# Patient Record
Sex: Female | Born: 1985
Health system: Southern US, Community
[De-identification: ages and names within clinical notes are randomized; demographics above are authoritative.]

---

## 2017-10-19 DIAGNOSIS — J301 Allergic rhinitis due to pollen: Secondary | ICD-10-CM | POA: Diagnosis not present

## 2017-10-19 DIAGNOSIS — J3081 Allergic rhinitis due to animal (cat) (dog) hair and dander: Secondary | ICD-10-CM | POA: Diagnosis not present

## 2017-10-20 DIAGNOSIS — J3089 Other allergic rhinitis: Secondary | ICD-10-CM | POA: Diagnosis not present

## 2017-11-01 DIAGNOSIS — J3081 Allergic rhinitis due to animal (cat) (dog) hair and dander: Secondary | ICD-10-CM | POA: Diagnosis not present

## 2017-11-01 DIAGNOSIS — J301 Allergic rhinitis due to pollen: Secondary | ICD-10-CM | POA: Diagnosis not present

## 2017-11-01 DIAGNOSIS — J3089 Other allergic rhinitis: Secondary | ICD-10-CM | POA: Diagnosis not present

## 2017-11-03 DIAGNOSIS — J3081 Allergic rhinitis due to animal (cat) (dog) hair and dander: Secondary | ICD-10-CM | POA: Diagnosis not present

## 2017-11-03 DIAGNOSIS — J301 Allergic rhinitis due to pollen: Secondary | ICD-10-CM | POA: Diagnosis not present

## 2017-11-03 DIAGNOSIS — J3089 Other allergic rhinitis: Secondary | ICD-10-CM | POA: Diagnosis not present

## 2017-11-08 DIAGNOSIS — J301 Allergic rhinitis due to pollen: Secondary | ICD-10-CM | POA: Diagnosis not present

## 2017-11-08 DIAGNOSIS — J3081 Allergic rhinitis due to animal (cat) (dog) hair and dander: Secondary | ICD-10-CM | POA: Diagnosis not present

## 2017-11-08 DIAGNOSIS — J3089 Other allergic rhinitis: Secondary | ICD-10-CM | POA: Diagnosis not present

## 2017-11-15 DIAGNOSIS — J3089 Other allergic rhinitis: Secondary | ICD-10-CM | POA: Diagnosis not present

## 2017-11-15 DIAGNOSIS — J301 Allergic rhinitis due to pollen: Secondary | ICD-10-CM | POA: Diagnosis not present

## 2017-11-15 DIAGNOSIS — J3081 Allergic rhinitis due to animal (cat) (dog) hair and dander: Secondary | ICD-10-CM | POA: Diagnosis not present

## 2017-11-17 DIAGNOSIS — J301 Allergic rhinitis due to pollen: Secondary | ICD-10-CM | POA: Diagnosis not present

## 2017-11-17 DIAGNOSIS — J3089 Other allergic rhinitis: Secondary | ICD-10-CM | POA: Diagnosis not present

## 2017-11-17 DIAGNOSIS — J3081 Allergic rhinitis due to animal (cat) (dog) hair and dander: Secondary | ICD-10-CM | POA: Diagnosis not present

## 2017-11-22 DIAGNOSIS — J3081 Allergic rhinitis due to animal (cat) (dog) hair and dander: Secondary | ICD-10-CM | POA: Diagnosis not present

## 2017-11-22 DIAGNOSIS — J301 Allergic rhinitis due to pollen: Secondary | ICD-10-CM | POA: Diagnosis not present

## 2017-11-22 DIAGNOSIS — J3089 Other allergic rhinitis: Secondary | ICD-10-CM | POA: Diagnosis not present

## 2017-11-25 DIAGNOSIS — J301 Allergic rhinitis due to pollen: Secondary | ICD-10-CM | POA: Diagnosis not present

## 2017-11-25 DIAGNOSIS — J3081 Allergic rhinitis due to animal (cat) (dog) hair and dander: Secondary | ICD-10-CM | POA: Diagnosis not present

## 2017-11-25 DIAGNOSIS — J3089 Other allergic rhinitis: Secondary | ICD-10-CM | POA: Diagnosis not present

## 2017-11-29 DIAGNOSIS — J3089 Other allergic rhinitis: Secondary | ICD-10-CM | POA: Diagnosis not present

## 2017-11-29 DIAGNOSIS — J3081 Allergic rhinitis due to animal (cat) (dog) hair and dander: Secondary | ICD-10-CM | POA: Diagnosis not present

## 2017-11-29 DIAGNOSIS — J301 Allergic rhinitis due to pollen: Secondary | ICD-10-CM | POA: Diagnosis not present

## 2017-12-01 DIAGNOSIS — J3089 Other allergic rhinitis: Secondary | ICD-10-CM | POA: Diagnosis not present

## 2017-12-01 DIAGNOSIS — J3081 Allergic rhinitis due to animal (cat) (dog) hair and dander: Secondary | ICD-10-CM | POA: Diagnosis not present

## 2017-12-01 DIAGNOSIS — J301 Allergic rhinitis due to pollen: Secondary | ICD-10-CM | POA: Diagnosis not present

## 2017-12-12 DIAGNOSIS — J3081 Allergic rhinitis due to animal (cat) (dog) hair and dander: Secondary | ICD-10-CM | POA: Diagnosis not present

## 2017-12-12 DIAGNOSIS — J301 Allergic rhinitis due to pollen: Secondary | ICD-10-CM | POA: Diagnosis not present

## 2017-12-12 DIAGNOSIS — J3089 Other allergic rhinitis: Secondary | ICD-10-CM | POA: Diagnosis not present

## 2017-12-15 DIAGNOSIS — J3089 Other allergic rhinitis: Secondary | ICD-10-CM | POA: Diagnosis not present

## 2017-12-15 DIAGNOSIS — J3081 Allergic rhinitis due to animal (cat) (dog) hair and dander: Secondary | ICD-10-CM | POA: Diagnosis not present

## 2017-12-15 DIAGNOSIS — J301 Allergic rhinitis due to pollen: Secondary | ICD-10-CM | POA: Diagnosis not present

## 2017-12-20 DIAGNOSIS — J3081 Allergic rhinitis due to animal (cat) (dog) hair and dander: Secondary | ICD-10-CM | POA: Diagnosis not present

## 2017-12-20 DIAGNOSIS — J301 Allergic rhinitis due to pollen: Secondary | ICD-10-CM | POA: Diagnosis not present

## 2017-12-20 DIAGNOSIS — J3089 Other allergic rhinitis: Secondary | ICD-10-CM | POA: Diagnosis not present

## 2017-12-22 DIAGNOSIS — J3089 Other allergic rhinitis: Secondary | ICD-10-CM | POA: Diagnosis not present

## 2017-12-22 DIAGNOSIS — J3081 Allergic rhinitis due to animal (cat) (dog) hair and dander: Secondary | ICD-10-CM | POA: Diagnosis not present

## 2017-12-22 DIAGNOSIS — J301 Allergic rhinitis due to pollen: Secondary | ICD-10-CM | POA: Diagnosis not present

## 2017-12-27 DIAGNOSIS — J3089 Other allergic rhinitis: Secondary | ICD-10-CM | POA: Diagnosis not present

## 2017-12-27 DIAGNOSIS — J3081 Allergic rhinitis due to animal (cat) (dog) hair and dander: Secondary | ICD-10-CM | POA: Diagnosis not present

## 2017-12-27 DIAGNOSIS — J301 Allergic rhinitis due to pollen: Secondary | ICD-10-CM | POA: Diagnosis not present

## 2017-12-29 DIAGNOSIS — J3081 Allergic rhinitis due to animal (cat) (dog) hair and dander: Secondary | ICD-10-CM | POA: Diagnosis not present

## 2017-12-29 DIAGNOSIS — J3089 Other allergic rhinitis: Secondary | ICD-10-CM | POA: Diagnosis not present

## 2017-12-29 DIAGNOSIS — J301 Allergic rhinitis due to pollen: Secondary | ICD-10-CM | POA: Diagnosis not present

## 2018-01-03 DIAGNOSIS — J301 Allergic rhinitis due to pollen: Secondary | ICD-10-CM | POA: Diagnosis not present

## 2018-01-03 DIAGNOSIS — J3081 Allergic rhinitis due to animal (cat) (dog) hair and dander: Secondary | ICD-10-CM | POA: Diagnosis not present

## 2018-01-03 DIAGNOSIS — J3089 Other allergic rhinitis: Secondary | ICD-10-CM | POA: Diagnosis not present

## 2018-01-05 DIAGNOSIS — J301 Allergic rhinitis due to pollen: Secondary | ICD-10-CM | POA: Diagnosis not present

## 2018-01-05 DIAGNOSIS — J3081 Allergic rhinitis due to animal (cat) (dog) hair and dander: Secondary | ICD-10-CM | POA: Diagnosis not present

## 2018-01-05 DIAGNOSIS — J3089 Other allergic rhinitis: Secondary | ICD-10-CM | POA: Diagnosis not present

## 2018-01-10 DIAGNOSIS — J3081 Allergic rhinitis due to animal (cat) (dog) hair and dander: Secondary | ICD-10-CM | POA: Diagnosis not present

## 2018-01-10 DIAGNOSIS — J3089 Other allergic rhinitis: Secondary | ICD-10-CM | POA: Diagnosis not present

## 2018-01-10 DIAGNOSIS — J301 Allergic rhinitis due to pollen: Secondary | ICD-10-CM | POA: Diagnosis not present

## 2018-01-12 DIAGNOSIS — J3089 Other allergic rhinitis: Secondary | ICD-10-CM | POA: Diagnosis not present

## 2018-01-12 DIAGNOSIS — J3081 Allergic rhinitis due to animal (cat) (dog) hair and dander: Secondary | ICD-10-CM | POA: Diagnosis not present

## 2018-01-12 DIAGNOSIS — J301 Allergic rhinitis due to pollen: Secondary | ICD-10-CM | POA: Diagnosis not present

## 2018-01-17 DIAGNOSIS — J3089 Other allergic rhinitis: Secondary | ICD-10-CM | POA: Diagnosis not present

## 2018-01-17 DIAGNOSIS — J301 Allergic rhinitis due to pollen: Secondary | ICD-10-CM | POA: Diagnosis not present

## 2018-01-17 DIAGNOSIS — J3081 Allergic rhinitis due to animal (cat) (dog) hair and dander: Secondary | ICD-10-CM | POA: Diagnosis not present

## 2018-01-20 DIAGNOSIS — H1045 Other chronic allergic conjunctivitis: Secondary | ICD-10-CM | POA: Diagnosis not present

## 2018-01-20 DIAGNOSIS — J301 Allergic rhinitis due to pollen: Secondary | ICD-10-CM | POA: Diagnosis not present

## 2018-01-20 DIAGNOSIS — J3081 Allergic rhinitis due to animal (cat) (dog) hair and dander: Secondary | ICD-10-CM | POA: Diagnosis not present

## 2018-01-20 DIAGNOSIS — J3089 Other allergic rhinitis: Secondary | ICD-10-CM | POA: Diagnosis not present

## 2018-01-23 DIAGNOSIS — J301 Allergic rhinitis due to pollen: Secondary | ICD-10-CM | POA: Diagnosis not present

## 2018-01-23 DIAGNOSIS — J3089 Other allergic rhinitis: Secondary | ICD-10-CM | POA: Diagnosis not present

## 2018-01-23 DIAGNOSIS — J3081 Allergic rhinitis due to animal (cat) (dog) hair and dander: Secondary | ICD-10-CM | POA: Diagnosis not present

## 2018-01-31 DIAGNOSIS — J3081 Allergic rhinitis due to animal (cat) (dog) hair and dander: Secondary | ICD-10-CM | POA: Diagnosis not present

## 2018-01-31 DIAGNOSIS — J301 Allergic rhinitis due to pollen: Secondary | ICD-10-CM | POA: Diagnosis not present

## 2018-01-31 DIAGNOSIS — J3089 Other allergic rhinitis: Secondary | ICD-10-CM | POA: Diagnosis not present

## 2018-02-06 DIAGNOSIS — J3081 Allergic rhinitis due to animal (cat) (dog) hair and dander: Secondary | ICD-10-CM | POA: Diagnosis not present

## 2018-02-06 DIAGNOSIS — J3089 Other allergic rhinitis: Secondary | ICD-10-CM | POA: Diagnosis not present

## 2018-02-06 DIAGNOSIS — J301 Allergic rhinitis due to pollen: Secondary | ICD-10-CM | POA: Diagnosis not present

## 2018-02-14 DIAGNOSIS — J301 Allergic rhinitis due to pollen: Secondary | ICD-10-CM | POA: Diagnosis not present

## 2018-02-14 DIAGNOSIS — J3081 Allergic rhinitis due to animal (cat) (dog) hair and dander: Secondary | ICD-10-CM | POA: Diagnosis not present

## 2018-02-14 DIAGNOSIS — J3089 Other allergic rhinitis: Secondary | ICD-10-CM | POA: Diagnosis not present

## 2018-02-16 DIAGNOSIS — J301 Allergic rhinitis due to pollen: Secondary | ICD-10-CM | POA: Diagnosis not present

## 2018-02-16 DIAGNOSIS — J3081 Allergic rhinitis due to animal (cat) (dog) hair and dander: Secondary | ICD-10-CM | POA: Diagnosis not present

## 2018-02-17 DIAGNOSIS — J3089 Other allergic rhinitis: Secondary | ICD-10-CM | POA: Diagnosis not present

## 2018-02-21 DIAGNOSIS — J3081 Allergic rhinitis due to animal (cat) (dog) hair and dander: Secondary | ICD-10-CM | POA: Diagnosis not present

## 2018-02-21 DIAGNOSIS — J301 Allergic rhinitis due to pollen: Secondary | ICD-10-CM | POA: Diagnosis not present

## 2018-02-21 DIAGNOSIS — J3089 Other allergic rhinitis: Secondary | ICD-10-CM | POA: Diagnosis not present

## 2018-02-28 DIAGNOSIS — J3081 Allergic rhinitis due to animal (cat) (dog) hair and dander: Secondary | ICD-10-CM | POA: Diagnosis not present

## 2018-02-28 DIAGNOSIS — J301 Allergic rhinitis due to pollen: Secondary | ICD-10-CM | POA: Diagnosis not present

## 2018-02-28 DIAGNOSIS — J3089 Other allergic rhinitis: Secondary | ICD-10-CM | POA: Diagnosis not present

## 2018-03-06 DIAGNOSIS — J301 Allergic rhinitis due to pollen: Secondary | ICD-10-CM | POA: Diagnosis not present

## 2018-03-06 DIAGNOSIS — J3089 Other allergic rhinitis: Secondary | ICD-10-CM | POA: Diagnosis not present

## 2018-03-06 DIAGNOSIS — J3081 Allergic rhinitis due to animal (cat) (dog) hair and dander: Secondary | ICD-10-CM | POA: Diagnosis not present

## 2018-03-14 DIAGNOSIS — J301 Allergic rhinitis due to pollen: Secondary | ICD-10-CM | POA: Diagnosis not present

## 2018-03-14 DIAGNOSIS — J3089 Other allergic rhinitis: Secondary | ICD-10-CM | POA: Diagnosis not present

## 2018-03-14 DIAGNOSIS — J3081 Allergic rhinitis due to animal (cat) (dog) hair and dander: Secondary | ICD-10-CM | POA: Diagnosis not present

## 2018-03-16 DIAGNOSIS — J3089 Other allergic rhinitis: Secondary | ICD-10-CM | POA: Diagnosis not present

## 2018-03-16 DIAGNOSIS — J3081 Allergic rhinitis due to animal (cat) (dog) hair and dander: Secondary | ICD-10-CM | POA: Diagnosis not present

## 2018-03-16 DIAGNOSIS — J301 Allergic rhinitis due to pollen: Secondary | ICD-10-CM | POA: Diagnosis not present

## 2018-03-21 DIAGNOSIS — J3089 Other allergic rhinitis: Secondary | ICD-10-CM | POA: Diagnosis not present

## 2018-03-21 DIAGNOSIS — J3081 Allergic rhinitis due to animal (cat) (dog) hair and dander: Secondary | ICD-10-CM | POA: Diagnosis not present

## 2018-03-21 DIAGNOSIS — J301 Allergic rhinitis due to pollen: Secondary | ICD-10-CM | POA: Diagnosis not present

## 2018-03-23 DIAGNOSIS — J3089 Other allergic rhinitis: Secondary | ICD-10-CM | POA: Diagnosis not present

## 2018-03-23 DIAGNOSIS — J301 Allergic rhinitis due to pollen: Secondary | ICD-10-CM | POA: Diagnosis not present

## 2018-03-27 DIAGNOSIS — J3089 Other allergic rhinitis: Secondary | ICD-10-CM | POA: Diagnosis not present

## 2018-03-27 DIAGNOSIS — Z1389 Encounter for screening for other disorder: Secondary | ICD-10-CM | POA: Diagnosis not present

## 2018-03-27 DIAGNOSIS — Z30431 Encounter for routine checking of intrauterine contraceptive device: Secondary | ICD-10-CM | POA: Diagnosis not present

## 2018-03-27 DIAGNOSIS — Z130 Encounter for screening for diseases of the blood and blood-forming organs and certain disorders involving the immune mechanism: Secondary | ICD-10-CM | POA: Diagnosis not present

## 2018-03-27 DIAGNOSIS — Z01419 Encounter for gynecological examination (general) (routine) without abnormal findings: Secondary | ICD-10-CM | POA: Diagnosis not present

## 2018-03-27 DIAGNOSIS — J3081 Allergic rhinitis due to animal (cat) (dog) hair and dander: Secondary | ICD-10-CM | POA: Diagnosis not present

## 2018-03-27 DIAGNOSIS — J301 Allergic rhinitis due to pollen: Secondary | ICD-10-CM | POA: Diagnosis not present

## 2018-04-03 DIAGNOSIS — J301 Allergic rhinitis due to pollen: Secondary | ICD-10-CM | POA: Diagnosis not present

## 2018-04-03 DIAGNOSIS — J3081 Allergic rhinitis due to animal (cat) (dog) hair and dander: Secondary | ICD-10-CM | POA: Diagnosis not present

## 2018-04-03 DIAGNOSIS — J3089 Other allergic rhinitis: Secondary | ICD-10-CM | POA: Diagnosis not present

## 2018-04-11 DIAGNOSIS — J301 Allergic rhinitis due to pollen: Secondary | ICD-10-CM | POA: Diagnosis not present

## 2018-04-11 DIAGNOSIS — J3089 Other allergic rhinitis: Secondary | ICD-10-CM | POA: Diagnosis not present

## 2018-04-11 DIAGNOSIS — J3081 Allergic rhinitis due to animal (cat) (dog) hair and dander: Secondary | ICD-10-CM | POA: Diagnosis not present

## 2018-04-17 DIAGNOSIS — J3081 Allergic rhinitis due to animal (cat) (dog) hair and dander: Secondary | ICD-10-CM | POA: Diagnosis not present

## 2018-04-17 DIAGNOSIS — J3089 Other allergic rhinitis: Secondary | ICD-10-CM | POA: Diagnosis not present

## 2018-04-17 DIAGNOSIS — J301 Allergic rhinitis due to pollen: Secondary | ICD-10-CM | POA: Diagnosis not present

## 2018-04-24 DIAGNOSIS — J3081 Allergic rhinitis due to animal (cat) (dog) hair and dander: Secondary | ICD-10-CM | POA: Diagnosis not present

## 2018-04-24 DIAGNOSIS — J301 Allergic rhinitis due to pollen: Secondary | ICD-10-CM | POA: Diagnosis not present

## 2018-04-24 DIAGNOSIS — J3089 Other allergic rhinitis: Secondary | ICD-10-CM | POA: Diagnosis not present

## 2018-05-04 DIAGNOSIS — J301 Allergic rhinitis due to pollen: Secondary | ICD-10-CM | POA: Diagnosis not present

## 2018-05-04 DIAGNOSIS — J3081 Allergic rhinitis due to animal (cat) (dog) hair and dander: Secondary | ICD-10-CM | POA: Diagnosis not present

## 2018-05-04 DIAGNOSIS — J3089 Other allergic rhinitis: Secondary | ICD-10-CM | POA: Diagnosis not present

## 2018-05-08 DIAGNOSIS — J301 Allergic rhinitis due to pollen: Secondary | ICD-10-CM | POA: Diagnosis not present

## 2018-05-08 DIAGNOSIS — J3089 Other allergic rhinitis: Secondary | ICD-10-CM | POA: Diagnosis not present

## 2018-05-08 DIAGNOSIS — J3081 Allergic rhinitis due to animal (cat) (dog) hair and dander: Secondary | ICD-10-CM | POA: Diagnosis not present

## 2018-05-15 DIAGNOSIS — J301 Allergic rhinitis due to pollen: Secondary | ICD-10-CM | POA: Diagnosis not present

## 2018-05-15 DIAGNOSIS — J3081 Allergic rhinitis due to animal (cat) (dog) hair and dander: Secondary | ICD-10-CM | POA: Diagnosis not present

## 2018-05-15 DIAGNOSIS — J3089 Other allergic rhinitis: Secondary | ICD-10-CM | POA: Diagnosis not present

## 2018-05-25 DIAGNOSIS — J3081 Allergic rhinitis due to animal (cat) (dog) hair and dander: Secondary | ICD-10-CM | POA: Diagnosis not present

## 2018-05-25 DIAGNOSIS — J3089 Other allergic rhinitis: Secondary | ICD-10-CM | POA: Diagnosis not present

## 2018-05-25 DIAGNOSIS — J301 Allergic rhinitis due to pollen: Secondary | ICD-10-CM | POA: Diagnosis not present

## 2018-05-26 DIAGNOSIS — Z30432 Encounter for removal of intrauterine contraceptive device: Secondary | ICD-10-CM | POA: Diagnosis not present

## 2018-05-29 DIAGNOSIS — J301 Allergic rhinitis due to pollen: Secondary | ICD-10-CM | POA: Diagnosis not present

## 2018-05-29 DIAGNOSIS — J3089 Other allergic rhinitis: Secondary | ICD-10-CM | POA: Diagnosis not present

## 2018-05-29 DIAGNOSIS — J3081 Allergic rhinitis due to animal (cat) (dog) hair and dander: Secondary | ICD-10-CM | POA: Diagnosis not present

## 2018-06-06 DIAGNOSIS — J3081 Allergic rhinitis due to animal (cat) (dog) hair and dander: Secondary | ICD-10-CM | POA: Diagnosis not present

## 2018-06-06 DIAGNOSIS — J3089 Other allergic rhinitis: Secondary | ICD-10-CM | POA: Diagnosis not present

## 2018-06-06 DIAGNOSIS — J301 Allergic rhinitis due to pollen: Secondary | ICD-10-CM | POA: Diagnosis not present

## 2018-06-14 DIAGNOSIS — J3081 Allergic rhinitis due to animal (cat) (dog) hair and dander: Secondary | ICD-10-CM | POA: Diagnosis not present

## 2018-06-14 DIAGNOSIS — J301 Allergic rhinitis due to pollen: Secondary | ICD-10-CM | POA: Diagnosis not present

## 2018-06-14 DIAGNOSIS — J3089 Other allergic rhinitis: Secondary | ICD-10-CM | POA: Diagnosis not present

## 2018-06-19 DIAGNOSIS — J3089 Other allergic rhinitis: Secondary | ICD-10-CM | POA: Diagnosis not present

## 2018-06-19 DIAGNOSIS — J3081 Allergic rhinitis due to animal (cat) (dog) hair and dander: Secondary | ICD-10-CM | POA: Diagnosis not present

## 2018-06-19 DIAGNOSIS — J301 Allergic rhinitis due to pollen: Secondary | ICD-10-CM | POA: Diagnosis not present

## 2018-06-26 DIAGNOSIS — J3089 Other allergic rhinitis: Secondary | ICD-10-CM | POA: Diagnosis not present

## 2018-06-26 DIAGNOSIS — J3081 Allergic rhinitis due to animal (cat) (dog) hair and dander: Secondary | ICD-10-CM | POA: Diagnosis not present

## 2018-06-26 DIAGNOSIS — J301 Allergic rhinitis due to pollen: Secondary | ICD-10-CM | POA: Diagnosis not present

## 2018-07-04 DIAGNOSIS — J3081 Allergic rhinitis due to animal (cat) (dog) hair and dander: Secondary | ICD-10-CM | POA: Diagnosis not present

## 2018-07-04 DIAGNOSIS — J301 Allergic rhinitis due to pollen: Secondary | ICD-10-CM | POA: Diagnosis not present

## 2018-07-04 DIAGNOSIS — J3089 Other allergic rhinitis: Secondary | ICD-10-CM | POA: Diagnosis not present

## 2018-07-10 DIAGNOSIS — J3081 Allergic rhinitis due to animal (cat) (dog) hair and dander: Secondary | ICD-10-CM | POA: Diagnosis not present

## 2018-07-10 DIAGNOSIS — J301 Allergic rhinitis due to pollen: Secondary | ICD-10-CM | POA: Diagnosis not present

## 2018-07-10 DIAGNOSIS — J3089 Other allergic rhinitis: Secondary | ICD-10-CM | POA: Diagnosis not present

## 2018-07-18 DIAGNOSIS — J3081 Allergic rhinitis due to animal (cat) (dog) hair and dander: Secondary | ICD-10-CM | POA: Diagnosis not present

## 2018-07-18 DIAGNOSIS — J3089 Other allergic rhinitis: Secondary | ICD-10-CM | POA: Diagnosis not present

## 2018-07-18 DIAGNOSIS — J301 Allergic rhinitis due to pollen: Secondary | ICD-10-CM | POA: Diagnosis not present

## 2018-07-24 DIAGNOSIS — J301 Allergic rhinitis due to pollen: Secondary | ICD-10-CM | POA: Diagnosis not present

## 2018-07-25 DIAGNOSIS — J3089 Other allergic rhinitis: Secondary | ICD-10-CM | POA: Diagnosis not present

## 2018-07-26 DIAGNOSIS — J301 Allergic rhinitis due to pollen: Secondary | ICD-10-CM | POA: Diagnosis not present

## 2018-07-26 DIAGNOSIS — J3089 Other allergic rhinitis: Secondary | ICD-10-CM | POA: Diagnosis not present

## 2018-07-26 DIAGNOSIS — J3081 Allergic rhinitis due to animal (cat) (dog) hair and dander: Secondary | ICD-10-CM | POA: Diagnosis not present

## 2018-08-01 DIAGNOSIS — J3089 Other allergic rhinitis: Secondary | ICD-10-CM | POA: Diagnosis not present

## 2018-08-01 DIAGNOSIS — J301 Allergic rhinitis due to pollen: Secondary | ICD-10-CM | POA: Diagnosis not present

## 2018-08-01 DIAGNOSIS — J3081 Allergic rhinitis due to animal (cat) (dog) hair and dander: Secondary | ICD-10-CM | POA: Diagnosis not present

## 2018-08-07 DIAGNOSIS — J3089 Other allergic rhinitis: Secondary | ICD-10-CM | POA: Diagnosis not present

## 2018-08-07 DIAGNOSIS — J301 Allergic rhinitis due to pollen: Secondary | ICD-10-CM | POA: Diagnosis not present

## 2018-08-07 DIAGNOSIS — J3081 Allergic rhinitis due to animal (cat) (dog) hair and dander: Secondary | ICD-10-CM | POA: Diagnosis not present

## 2018-08-10 DIAGNOSIS — J301 Allergic rhinitis due to pollen: Secondary | ICD-10-CM | POA: Diagnosis not present

## 2018-08-10 DIAGNOSIS — J3081 Allergic rhinitis due to animal (cat) (dog) hair and dander: Secondary | ICD-10-CM | POA: Diagnosis not present

## 2018-08-10 DIAGNOSIS — J3089 Other allergic rhinitis: Secondary | ICD-10-CM | POA: Diagnosis not present

## 2018-08-14 DIAGNOSIS — J3081 Allergic rhinitis due to animal (cat) (dog) hair and dander: Secondary | ICD-10-CM | POA: Diagnosis not present

## 2018-08-14 DIAGNOSIS — J301 Allergic rhinitis due to pollen: Secondary | ICD-10-CM | POA: Diagnosis not present

## 2018-08-14 DIAGNOSIS — J3089 Other allergic rhinitis: Secondary | ICD-10-CM | POA: Diagnosis not present

## 2018-08-17 DIAGNOSIS — J3089 Other allergic rhinitis: Secondary | ICD-10-CM | POA: Diagnosis not present

## 2018-08-17 DIAGNOSIS — J3081 Allergic rhinitis due to animal (cat) (dog) hair and dander: Secondary | ICD-10-CM | POA: Diagnosis not present

## 2018-08-17 DIAGNOSIS — J301 Allergic rhinitis due to pollen: Secondary | ICD-10-CM | POA: Diagnosis not present

## 2018-08-22 DIAGNOSIS — J3081 Allergic rhinitis due to animal (cat) (dog) hair and dander: Secondary | ICD-10-CM | POA: Diagnosis not present

## 2018-08-22 DIAGNOSIS — J3089 Other allergic rhinitis: Secondary | ICD-10-CM | POA: Diagnosis not present

## 2018-08-22 DIAGNOSIS — J301 Allergic rhinitis due to pollen: Secondary | ICD-10-CM | POA: Diagnosis not present

## 2018-08-29 DIAGNOSIS — J301 Allergic rhinitis due to pollen: Secondary | ICD-10-CM | POA: Diagnosis not present

## 2018-08-29 DIAGNOSIS — J3081 Allergic rhinitis due to animal (cat) (dog) hair and dander: Secondary | ICD-10-CM | POA: Diagnosis not present

## 2018-08-29 DIAGNOSIS — J3089 Other allergic rhinitis: Secondary | ICD-10-CM | POA: Diagnosis not present

## 2018-09-05 DIAGNOSIS — J3081 Allergic rhinitis due to animal (cat) (dog) hair and dander: Secondary | ICD-10-CM | POA: Diagnosis not present

## 2018-09-05 DIAGNOSIS — J301 Allergic rhinitis due to pollen: Secondary | ICD-10-CM | POA: Diagnosis not present

## 2018-09-05 DIAGNOSIS — J3089 Other allergic rhinitis: Secondary | ICD-10-CM | POA: Diagnosis not present

## 2018-09-11 DIAGNOSIS — J3089 Other allergic rhinitis: Secondary | ICD-10-CM | POA: Diagnosis not present

## 2018-09-11 DIAGNOSIS — J301 Allergic rhinitis due to pollen: Secondary | ICD-10-CM | POA: Diagnosis not present

## 2018-09-11 DIAGNOSIS — J3081 Allergic rhinitis due to animal (cat) (dog) hair and dander: Secondary | ICD-10-CM | POA: Diagnosis not present

## 2018-09-20 DIAGNOSIS — J3089 Other allergic rhinitis: Secondary | ICD-10-CM | POA: Diagnosis not present

## 2018-09-20 DIAGNOSIS — J3081 Allergic rhinitis due to animal (cat) (dog) hair and dander: Secondary | ICD-10-CM | POA: Diagnosis not present

## 2018-09-20 DIAGNOSIS — J301 Allergic rhinitis due to pollen: Secondary | ICD-10-CM | POA: Diagnosis not present

## 2018-09-25 DIAGNOSIS — J3081 Allergic rhinitis due to animal (cat) (dog) hair and dander: Secondary | ICD-10-CM | POA: Diagnosis not present

## 2018-09-25 DIAGNOSIS — J3089 Other allergic rhinitis: Secondary | ICD-10-CM | POA: Diagnosis not present

## 2018-09-25 DIAGNOSIS — J301 Allergic rhinitis due to pollen: Secondary | ICD-10-CM | POA: Diagnosis not present

## 2018-10-03 DIAGNOSIS — J3081 Allergic rhinitis due to animal (cat) (dog) hair and dander: Secondary | ICD-10-CM | POA: Diagnosis not present

## 2018-10-03 DIAGNOSIS — J301 Allergic rhinitis due to pollen: Secondary | ICD-10-CM | POA: Diagnosis not present

## 2018-10-03 DIAGNOSIS — J3089 Other allergic rhinitis: Secondary | ICD-10-CM | POA: Diagnosis not present

## 2018-10-09 DIAGNOSIS — J3081 Allergic rhinitis due to animal (cat) (dog) hair and dander: Secondary | ICD-10-CM | POA: Diagnosis not present

## 2018-10-09 DIAGNOSIS — J301 Allergic rhinitis due to pollen: Secondary | ICD-10-CM | POA: Diagnosis not present

## 2018-10-09 DIAGNOSIS — J3089 Other allergic rhinitis: Secondary | ICD-10-CM | POA: Diagnosis not present

## 2018-10-18 DIAGNOSIS — J301 Allergic rhinitis due to pollen: Secondary | ICD-10-CM | POA: Diagnosis not present

## 2018-10-18 DIAGNOSIS — J3089 Other allergic rhinitis: Secondary | ICD-10-CM | POA: Diagnosis not present

## 2018-10-18 DIAGNOSIS — J3081 Allergic rhinitis due to animal (cat) (dog) hair and dander: Secondary | ICD-10-CM | POA: Diagnosis not present

## 2018-10-24 DIAGNOSIS — J301 Allergic rhinitis due to pollen: Secondary | ICD-10-CM | POA: Diagnosis not present

## 2018-10-24 DIAGNOSIS — J3081 Allergic rhinitis due to animal (cat) (dog) hair and dander: Secondary | ICD-10-CM | POA: Diagnosis not present

## 2018-10-24 DIAGNOSIS — J3089 Other allergic rhinitis: Secondary | ICD-10-CM | POA: Diagnosis not present

## 2018-10-27 DIAGNOSIS — L700 Acne vulgaris: Secondary | ICD-10-CM | POA: Diagnosis not present

## 2018-10-27 DIAGNOSIS — L814 Other melanin hyperpigmentation: Secondary | ICD-10-CM | POA: Diagnosis not present

## 2018-10-27 DIAGNOSIS — D225 Melanocytic nevi of trunk: Secondary | ICD-10-CM | POA: Diagnosis not present

## 2018-10-27 DIAGNOSIS — D2271 Melanocytic nevi of right lower limb, including hip: Secondary | ICD-10-CM | POA: Diagnosis not present

## 2018-10-28 ENCOUNTER — Other Ambulatory Visit: Payer: Self-pay

## 2018-10-28 DIAGNOSIS — N83291 Other ovarian cyst, right side: Secondary | ICD-10-CM | POA: Insufficient documentation

## 2018-10-28 DIAGNOSIS — Z20828 Contact with and (suspected) exposure to other viral communicable diseases: Secondary | ICD-10-CM | POA: Diagnosis not present

## 2018-10-28 DIAGNOSIS — N801 Endometriosis of ovary: Principal | ICD-10-CM | POA: Insufficient documentation

## 2018-10-28 DIAGNOSIS — R9431 Abnormal electrocardiogram [ECG] [EKG]: Secondary | ICD-10-CM | POA: Insufficient documentation

## 2018-10-28 DIAGNOSIS — R10811 Right upper quadrant abdominal tenderness: Secondary | ICD-10-CM | POA: Diagnosis not present

## 2018-10-28 DIAGNOSIS — N838 Other noninflammatory disorders of ovary, fallopian tube and broad ligament: Secondary | ICD-10-CM | POA: Insufficient documentation

## 2018-10-28 DIAGNOSIS — Z791 Long term (current) use of non-steroidal anti-inflammatories (NSAID): Secondary | ICD-10-CM | POA: Diagnosis not present

## 2018-10-28 DIAGNOSIS — Z1159 Encounter for screening for other viral diseases: Secondary | ICD-10-CM | POA: Insufficient documentation

## 2018-10-28 DIAGNOSIS — K661 Hemoperitoneum: Secondary | ICD-10-CM | POA: Diagnosis not present

## 2018-10-28 DIAGNOSIS — R100 Acute abdomen: Secondary | ICD-10-CM | POA: Diagnosis not present

## 2018-10-28 DIAGNOSIS — R197 Diarrhea, unspecified: Secondary | ICD-10-CM | POA: Diagnosis not present

## 2018-10-28 DIAGNOSIS — R58 Hemorrhage, not elsewhere classified: Secondary | ICD-10-CM | POA: Diagnosis not present

## 2018-10-28 DIAGNOSIS — R42 Dizziness and giddiness: Secondary | ICD-10-CM | POA: Diagnosis not present

## 2018-10-29 ENCOUNTER — Emergency Department (HOSPITAL_COMMUNITY): Payer: BC Managed Care – PPO | Admitting: Registered Nurse

## 2018-10-29 ENCOUNTER — Encounter (HOSPITAL_COMMUNITY)
Admission: EM | Disposition: A | Discharge status: Home / Self Care | Payer: Self-pay | Source: Home / Self Care | Attending: Emergency Medicine

## 2018-10-29 ENCOUNTER — Emergency Department (HOSPITAL_COMMUNITY): Payer: BC Managed Care – PPO

## 2018-10-29 ENCOUNTER — Encounter (HOSPITAL_COMMUNITY): Payer: Self-pay | Admitting: Emergency Medicine

## 2018-10-29 ENCOUNTER — Other Ambulatory Visit: Payer: Self-pay

## 2018-10-29 ENCOUNTER — Observation Stay (HOSPITAL_COMMUNITY)
Admission: EM | Admit: 2018-10-29 | Discharge: 2018-10-29 | Disposition: A | Discharge status: Home / Self Care | Payer: BC Managed Care – PPO | Attending: Obstetrics and Gynecology | Admitting: Obstetrics and Gynecology

## 2018-10-29 DIAGNOSIS — Z9889 Other specified postprocedural states: Secondary | ICD-10-CM

## 2018-10-29 DIAGNOSIS — R102 Pelvic and perineal pain: Secondary | ICD-10-CM | POA: Diagnosis not present

## 2018-10-29 DIAGNOSIS — N838 Other noninflammatory disorders of ovary, fallopian tube and broad ligament: Secondary | ICD-10-CM | POA: Diagnosis not present

## 2018-10-29 DIAGNOSIS — N801 Endometriosis of ovary: Secondary | ICD-10-CM | POA: Diagnosis not present

## 2018-10-29 DIAGNOSIS — R1030 Lower abdominal pain, unspecified: Secondary | ICD-10-CM

## 2018-10-29 DIAGNOSIS — N8301 Follicular cyst of right ovary: Secondary | ICD-10-CM | POA: Diagnosis not present

## 2018-10-29 DIAGNOSIS — R100 Acute abdomen: Secondary | ICD-10-CM | POA: Diagnosis present

## 2018-10-29 DIAGNOSIS — R58 Hemorrhage, not elsewhere classified: Secondary | ICD-10-CM

## 2018-10-29 DIAGNOSIS — N83209 Unspecified ovarian cyst, unspecified side: Secondary | ICD-10-CM

## 2018-10-29 DIAGNOSIS — N83201 Unspecified ovarian cyst, right side: Secondary | ICD-10-CM | POA: Diagnosis not present

## 2018-10-29 DIAGNOSIS — R10811 Right upper quadrant abdominal tenderness: Secondary | ICD-10-CM | POA: Diagnosis not present

## 2018-10-29 HISTORY — PX: LAPAROSCOPY: SHX197

## 2018-10-29 LAB — COMPREHENSIVE METABOLIC PANEL
ALT: 16 U/L (ref 0–44)
AST: 19 U/L (ref 15–41)
Albumin: 3.9 g/dL (ref 3.5–5.0)
Alkaline Phosphatase: 42 U/L (ref 38–126)
Anion gap: 8 (ref 5–15)
BUN: 14 mg/dL (ref 6–20)
CO2: 20 mmol/L — ABNORMAL LOW (ref 22–32)
Calcium: 8.7 mg/dL — ABNORMAL LOW (ref 8.9–10.3)
Chloride: 107 mmol/L (ref 98–111)
Creatinine, Ser: 0.79 mg/dL (ref 0.44–1.00)
GFR calc Af Amer: >60 mL/min (ref >60)
GFR calc non Af Amer: >60 mL/min (ref >60)
Glucose, Bld: 139 mg/dL — ABNORMAL HIGH (ref 70–99)
Potassium: 4.2 mmol/L (ref 3.5–5.1)
Sodium: 135 mmol/L (ref 135–145)
Total Bilirubin: 0.5 mg/dL (ref 0.3–1.2)
Total Protein: 6.6 g/dL (ref 6.5–8.1)

## 2018-10-29 LAB — CBC
HCT: 35.6 % — ABNORMAL LOW (ref 36.0–46.0)
Hemoglobin: 11.5 g/dL — ABNORMAL LOW (ref 12.0–15.0)
MCH: 29.3 pg (ref 26.0–34.0)
MCHC: 32.3 g/dL (ref 30.0–36.0)
MCV: 90.6 fL (ref 80.0–100.0)
Platelets: 225 10*3/uL (ref 150–400)
RBC: 3.93 MIL/uL (ref 3.87–5.11)
RDW: 12.7 % (ref 11.5–15.5)
WBC: 16.3 10*3/uL — ABNORMAL HIGH (ref 4.0–10.5)
nRBC: 0 % (ref 0.0–0.2)

## 2018-10-29 LAB — PREPARE RBC (CROSSMATCH)

## 2018-10-29 LAB — URINALYSIS, ROUTINE W REFLEX MICROSCOPIC
Bilirubin Urine: NEGATIVE
Glucose, UA: NEGATIVE mg/dL
Hgb urine dipstick: NEGATIVE
Ketones, ur: 20 mg/dL — AB
Leukocytes,Ua: NEGATIVE
Nitrite: NEGATIVE
Protein, ur: NEGATIVE mg/dL
Specific Gravity, Urine: >1.046 — ABNORMAL HIGH (ref 1.005–1.030)
pH: 5 (ref 5.0–8.0)

## 2018-10-29 LAB — SARS CORONAVIRUS 2 BY RT PCR (HOSPITAL ORDER, PERFORMED IN ~~LOC~~ HOSPITAL LAB): SARS Coronavirus 2: NEGATIVE

## 2018-10-29 LAB — I-STAT BETA HCG BLOOD, ED (MC, WL, AP ONLY): I-stat hCG, quantitative: <5 m[IU]/mL (ref <5)

## 2018-10-29 LAB — LIPASE, BLOOD: Lipase: 34 U/L (ref 11–51)

## 2018-10-29 LAB — ABO/RH: ABO/RH(D): B POS

## 2018-10-29 LAB — LACTIC ACID, PLASMA
Lactic Acid, Venous: 1.2 mmol/L (ref 0.5–1.9)
Lactic Acid, Venous: 0.8 mmol/L (ref 0.5–1.9)

## 2018-10-29 SURGERY — Surgical Case
Anesthesia: *Unknown

## 2018-10-29 SURGERY — LAPAROSCOPY, DIAGNOSTIC
Anesthesia: General | Site: Abdomen

## 2018-10-29 SURGERY — LAPAROSCOPY, DIAGNOSTIC
Anesthesia: General

## 2018-10-29 MED ORDER — BUPIVACAINE HCL (PF) 0.25 % IJ SOLN
INTRAMUSCULAR | Status: DC | PRN
  Administered 2018-10-29: 10 mL

## 2018-10-29 MED ORDER — SODIUM CHLORIDE 0.9% FLUSH
3.0000 mL | Freq: Once | INTRAVENOUS | Status: AC
  Administered 2018-10-29: 3 mL via INTRAVENOUS

## 2018-10-29 MED ORDER — SODIUM CHLORIDE 0.9 % IV SOLN
3.0000 g | Freq: Once | INTRAVENOUS | Status: AC
  Administered 2018-10-29: 3 g via INTRAVENOUS
  Filled 2018-10-29: qty 3

## 2018-10-29 MED ORDER — SODIUM CHLORIDE 0.9 % IR SOLN
Status: DC | PRN
  Administered 2018-10-29: 1000 mL

## 2018-10-29 MED ORDER — ACETAMINOPHEN 500 MG PO TABS
ORAL_TABLET | ORAL | Status: AC
  Administered 2018-10-29: 07:00:00 1000 mg
  Filled 2018-10-29: qty 2

## 2018-10-29 MED ORDER — SUGAMMADEX SODIUM 200 MG/2ML IV SOLN
INTRAVENOUS | Status: DC | PRN
  Administered 2018-10-29: 200 mg via INTRAVENOUS

## 2018-10-29 MED ORDER — MORPHINE SULFATE (PF) 4 MG/ML IV SOLN: 4.0000 mg | Freq: Once | INTRAVENOUS | Status: DC

## 2018-10-29 MED ORDER — SODIUM CHLORIDE 0.9 % IV BOLUS
1000.0000 mL | Freq: Once | INTRAVENOUS | Status: AC
  Administered 2018-10-29: 01:00:00 1000 mL via INTRAVENOUS

## 2018-10-29 MED ORDER — LIDOCAINE 2% (20 MG/ML) 5 ML SYRINGE
INTRAMUSCULAR | Status: DC | PRN
  Administered 2018-10-29: 40 mg via INTRAVENOUS
  Administered 2018-10-29: 60 mg via INTRAVENOUS

## 2018-10-29 MED ORDER — ONDANSETRON HCL 4 MG/2ML IJ SOLN
INTRAMUSCULAR | Status: AC
  Filled 2018-10-29: qty 2

## 2018-10-29 MED ORDER — SCOPOLAMINE 1 MG/3DAYS TD PT72
MEDICATED_PATCH | TRANSDERMAL | Status: AC
  Filled 2018-10-29: qty 1

## 2018-10-29 MED ORDER — FENTANYL CITRATE (PF) 100 MCG/2ML IJ SOLN
50.0000 ug | Freq: Once | INTRAMUSCULAR | Status: AC
  Administered 2018-10-29: 01:00:00 50 ug via INTRAVENOUS
  Filled 2018-10-29: qty 2

## 2018-10-29 MED ORDER — FENTANYL CITRATE (PF) 100 MCG/2ML IJ SOLN
INTRAMUSCULAR | Status: AC
  Filled 2018-10-29: qty 2

## 2018-10-29 MED ORDER — PROPOFOL 10 MG/ML IV BOLUS
INTRAVENOUS | Status: DC | PRN
  Administered 2018-10-29: 20 mg via INTRAVENOUS
  Administered 2018-10-29: 140 mg via INTRAVENOUS

## 2018-10-29 MED ORDER — SODIUM CHLORIDE (PF) 0.9 % IJ SOLN
INTRAMUSCULAR | Status: AC
  Filled 2018-10-29: qty 50

## 2018-10-29 MED ORDER — DEXAMETHASONE SODIUM PHOSPHATE 10 MG/ML IJ SOLN
INTRAMUSCULAR | Status: AC
  Filled 2018-10-29: qty 1

## 2018-10-29 MED ORDER — LACTATED RINGERS IV SOLN
INTRAVENOUS | Status: DC | PRN
  Administered 2018-10-29 (×2): via INTRAVENOUS

## 2018-10-29 MED ORDER — LACTATED RINGERS IV SOLN
INTRAVENOUS | Status: AC | PRN
  Administered 2018-10-29: 1000 mL

## 2018-10-29 MED ORDER — MORPHINE SULFATE (PF) 4 MG/ML IV SOLN
4.0000 mg | Freq: Once | INTRAVENOUS | Status: AC
  Administered 2018-10-29: 04:00:00 4 mg via INTRAVENOUS
  Filled 2018-10-29: qty 1

## 2018-10-29 MED ORDER — IBUPROFEN 600 MG PO TABS: 600.0000 mg | ORAL_TABLET | Freq: Four times a day (QID) | ORAL | 1 refills | Status: AC | PRN

## 2018-10-29 MED ORDER — FENTANYL CITRATE (PF) 250 MCG/5ML IJ SOLN
INTRAMUSCULAR | Status: DC | PRN
  Administered 2018-10-29 (×2): 100 ug via INTRAVENOUS
  Administered 2018-10-29: 50 ug via INTRAVENOUS

## 2018-10-29 MED ORDER — PHENYLEPHRINE 40 MCG/ML (10ML) SYRINGE FOR IV PUSH (FOR BLOOD PRESSURE SUPPORT)
PREFILLED_SYRINGE | INTRAVENOUS | Status: DC | PRN
  Administered 2018-10-29 (×2): 120 ug via INTRAVENOUS
  Administered 2018-10-29 (×2): 80 ug via INTRAVENOUS

## 2018-10-29 MED ORDER — OXYCODONE-ACETAMINOPHEN 5-325 MG PO TABS: 1.0000 | ORAL_TABLET | ORAL | 0 refills | Status: AC | PRN

## 2018-10-29 MED ORDER — BUPIVACAINE-EPINEPHRINE (PF) 0.25% -1:200000 IJ SOLN
INTRAMUSCULAR | Status: AC
  Filled 2018-10-29: qty 30

## 2018-10-29 MED ORDER — PROPOFOL 10 MG/ML IV BOLUS
INTRAVENOUS | Status: AC
  Filled 2018-10-29: qty 20

## 2018-10-29 MED ORDER — MIDAZOLAM HCL 2 MG/2ML IJ SOLN
INTRAMUSCULAR | Status: AC
  Filled 2018-10-29: qty 2

## 2018-10-29 MED ORDER — FENTANYL CITRATE (PF) 250 MCG/5ML IJ SOLN
INTRAMUSCULAR | Status: AC
  Filled 2018-10-29: qty 5

## 2018-10-29 MED ORDER — ONDANSETRON HCL 4 MG/2ML IJ SOLN
INTRAMUSCULAR | Status: DC | PRN
  Administered 2018-10-29: 4 mg via INTRAVENOUS

## 2018-10-29 MED ORDER — IOHEXOL 300 MG/ML  SOLN
100.0000 mL | Freq: Once | INTRAMUSCULAR | Status: AC | PRN
  Administered 2018-10-29: 100 mL via INTRAVENOUS

## 2018-10-29 MED ORDER — DEXAMETHASONE SODIUM PHOSPHATE 10 MG/ML IJ SOLN
INTRAMUSCULAR | Status: DC | PRN
  Administered 2018-10-29: 10 mg via INTRAVENOUS

## 2018-10-29 MED ORDER — ROCURONIUM BROMIDE 10 MG/ML (PF) SYRINGE
PREFILLED_SYRINGE | INTRAVENOUS | Status: DC | PRN
  Administered 2018-10-29: 5 mg via INTRAVENOUS
  Administered 2018-10-29: 30 mg via INTRAVENOUS

## 2018-10-29 MED ORDER — ONDANSETRON HCL 4 MG/2ML IJ SOLN
4.0000 mg | Freq: Once | INTRAMUSCULAR | Status: AC
  Administered 2018-10-29: 01:00:00 4 mg via INTRAVENOUS
  Filled 2018-10-29: qty 2

## 2018-10-29 MED ORDER — FENTANYL CITRATE (PF) 100 MCG/2ML IJ SOLN
25.0000 ug | INTRAMUSCULAR | Status: DC | PRN
  Administered 2018-10-29: 10:00:00 12.5 ug via INTRAVENOUS

## 2018-10-29 MED ORDER — FENTANYL CITRATE (PF) 100 MCG/2ML IJ SOLN
INTRAMUSCULAR | Status: DC | PRN
  Administered 2018-10-29 (×2): 25 ug via INTRAVENOUS
  Administered 2018-10-29: 50 ug via INTRAVENOUS

## 2018-10-29 MED ORDER — SUCCINYLCHOLINE CHLORIDE 200 MG/10ML IV SOSY
PREFILLED_SYRINGE | INTRAVENOUS | Status: DC | PRN
  Administered 2018-10-29: 80 mg via INTRAVENOUS

## 2018-10-29 SURGICAL SUPPLY — 37 items
CABLE HIGH FREQUENCY MONO STRZ (ELECTRODE) ×3 IMPLANT
CATH ROBINSON RED A/P 16FR (CATHETERS) IMPLANT
CHLORAPREP W/TINT 26 (MISCELLANEOUS) ×3 IMPLANT
COVER WAND RF STERILE (DRAPES) IMPLANT
DECANTER SPIKE VIAL GLASS SM (MISCELLANEOUS) ×3 IMPLANT
DERMABOND ADVANCED (GAUZE/BANDAGES/DRESSINGS) ×2
DERMABOND ADVANCED .7 DNX12 (GAUZE/BANDAGES/DRESSINGS) ×1 IMPLANT
DRAPE UNDERBUTTOCKS STRL (DRAPE) ×3 IMPLANT
DRSG OPSITE POSTOP 3X4 (GAUZE/BANDAGES/DRESSINGS) IMPLANT
FILTER SMOKE EVAC LAPAROSHD (FILTER) ×3 IMPLANT
GLOVE BIO SURGEON STRL SZ 6.5 (GLOVE) ×2 IMPLANT
GLOVE BIO SURGEONS STRL SZ 6.5 (GLOVE) ×1
GLOVE BIOGEL PI IND STRL 7.0 (GLOVE) ×1 IMPLANT
GLOVE BIOGEL PI INDICATOR 7.0 (GLOVE) ×2
GOWN STRL REUS W/TWL LRG LVL3 (GOWN DISPOSABLE) ×6 IMPLANT
KIT TURNOVER KIT A (KITS) IMPLANT
MANIPULATOR UTERINE 7CM CLEARV (MISCELLANEOUS) ×3 IMPLANT
NS IRRIG 1000ML POUR BTL (IV SOLUTION) ×3 IMPLANT
PACK LAPAROSCOPY BASIN (CUSTOM PROCEDURE TRAY) ×3 IMPLANT
PACK TRENDGUARD 450 HYBRID PRO (MISCELLANEOUS) IMPLANT
POUCH SPECIMEN RETRIEVAL 10MM (ENDOMECHANICALS) IMPLANT
PROTECTOR NERVE ULNAR (MISCELLANEOUS) ×6 IMPLANT
SET IRRIG TUBING LAPAROSCOPIC (IRRIGATION / IRRIGATOR) ×3 IMPLANT
SET TUBE SMOKE EVAC HIGH FLOW (TUBING) IMPLANT
SHEARS HARMONIC ACE PLUS 36CM (ENDOMECHANICALS) IMPLANT
SHEET LAVH (DRAPES) ×3 IMPLANT
SLEEVE XCEL OPT CAN 5 100 (ENDOMECHANICALS) ×3 IMPLANT
SOLUTION ELECTROLUBE (MISCELLANEOUS) IMPLANT
SUT VIC AB 3-0 PS2 18 (SUTURE) ×2
SUT VIC AB 3-0 PS2 18XBRD (SUTURE) ×1 IMPLANT
SUT VICRYL 0 UR6 27IN ABS (SUTURE) IMPLANT
SYSTEM CARTER THOMASON II (TROCAR) IMPLANT
TOWEL OR 17X26 10 PK STRL BLUE (TOWEL DISPOSABLE) ×6 IMPLANT
TRENDGUARD 450 HYBRID PRO PACK (MISCELLANEOUS)
TROCAR XCEL NON-BLD 11X100MML (ENDOMECHANICALS) ×3 IMPLANT
TROCAR XCEL NON-BLD 5MMX100MML (ENDOMECHANICALS) ×3 IMPLANT
WARMER LAPAROSCOPE (MISCELLANEOUS) IMPLANT

## 2018-10-29 NOTE — Anesthesia Procedure Notes (Signed)
Date/Time: 10/29/2018 9:18 AM Performed by: Cynda Familia, CRNA Oxygen Delivery Method: Simple face mask Placement Confirmation: positive ETCO2 and breath sounds checked- equal and bilateral Dental Injury: Teeth and Oropharynx as per pre-operative assessment

## 2018-10-29 NOTE — Discharge Instructions (Signed)
Call office with any concerns [phone] 

## 2018-10-29 NOTE — H&P (Signed)
Emma Hogan is an 33 y.o.G0 female with presents with complaint of acute abdominal pain. Pt reports pain initially occurred around 1400 yesterday. It was quickly followed by nausea and lightheadness and pelvic pressure ; " felt constipated" . As symptoms persisted she then developed r>l shoulder pain..  She  [facility]. Pt has been hemodynamically stable, with wbc of 16.3. lactic acid of 1.2. CT showed 18mm mass in right adnexa and ill defined density in left adnexa. Her Quant HCG is negative.  Pt has no plans for childbearing. Her husband has had a vasectomy. She did have her IUD removed in jan 2020 due to pelvic pain. She reports a remote history of ovarian cysts causing severe pain.  Pertinent Gynecological History: Menses: regular - next due in a week Bleeding: none Contraception: vasectomy DES exposure: unknown Blood transfusions: none Sexually transmitted diseases: no past history Previous GYN Procedures: none  OB History: G0, P0   Menstrual History: Menarche age: [age] No LMP recorded.    History reviewed. No pertinent past medical history.  History reviewed. No pertinent surgical history.  History reviewed. No pertinent family history.  Social History:  reports that she has never smoked. She has never used smokeless tobacco. She reports that she does not drink alcohol or use drugs.  Allergies: No Known Allergies  (Not in a hospital admission)   Review of Systems  Constitutional: Positive for malaise/fatigue. Negative for chills, fever and weight loss.  Respiratory: Negative for shortness of breath.   Cardiovascular: Negative for chest pain.  Gastrointestinal: Positive for abdominal pain, constipation and nausea. Negative for heartburn and vomiting.  Musculoskeletal: Positive for back pain. Negative for myalgias.  Skin: Negative for rash.  Neurological: Negative for dizziness.  Endo/Heme/Allergies: Does not bruise/bleed easily.  Psychiatric/Behavioral:  Negative for depression, hallucinations, substance abuse and suicidal ideas. The patient is not nervous/anxious.     Blood pressure (!) 107/55, pulse 70, temperature 98.4 F (36.9 C), temperature source Oral, resp. rate 16, height 5\' 3"  (1.6 m), weight 62.6 kg, SpO2 96 %. Physical Exam  Constitutional: She is oriented to person, place, and time. She appears well-developed.  Neck: Normal range of motion.  Cardiovascular: Normal rate.  Respiratory: Effort normal.  GI: Soft.  Musculoskeletal: Normal range of motion.  Neurological: She is alert and oriented to person, place, and time.  Skin: Skin is warm.  Psychiatric: She has a normal mood and affect. Her behavior is normal. Judgment and thought content normal.    Results for orders placed or performed during the hospital encounter of 10/29/18 (from the past 24 hour(s))  Lipase, blood     Status: None   Collection Time: 10/29/18 12:21 AM  Result Value Ref Range   Lipase 34 11 - 51 U/L  Comprehensive metabolic panel     Status: Abnormal   Collection Time: 10/29/18 12:21 AM  Result Value Ref Range   Sodium 135 135 - 145 mmol/L   Potassium 4.2 3.5 - 5.1 mmol/L   Chloride 107 98 - 111 mmol/L   CO2 20 (L) 22 - 32 mmol/L   Glucose, Bld 139 (H) 70 - 99 mg/dL   BUN 14 6 - 20 mg/dL   Creatinine, Ser 1.610.79 0.44 - 1.00 mg/dL   Calcium 8.7 (L) 8.9 - 10.3 mg/dL   Total Protein 6.6 6.5 - 8.1 g/dL   Albumin 3.9 3.5 - 5.0 g/dL   AST 19 15 - 41 U/L   ALT 16 0 - 44 U/L  Alkaline Phosphatase 42 38 - 126 U/L   Total Bilirubin 0.5 0.3 - 1.2 mg/dL   GFR calc non Af Amer >60 >60 mL/min   GFR calc Af Amer >60 >60 mL/min   Anion gap 8 5 - 15  CBC     Status: Abnormal   Collection Time: 10/29/18 12:21 AM  Result Value Ref Range   WBC 16.3 (H) 4.0 - 10.5 K/uL   RBC 3.93 3.87 - 5.11 MIL/uL   Hemoglobin 11.5 (L) 12.0 - 15.0 g/dL   HCT 35.6 (L) 36.0 - 46.0 %   MCV 90.6 80.0 - 100.0 fL   MCH 29.3 26.0 - 34.0 pg   MCHC 32.3 30.0 - 36.0 g/dL   RDW  12.7 11.5 - 15.5 %   Platelets 225 150 - 400 K/uL   nRBC 0.0 0.0 - 0.2 %  I-Stat beta hCG blood, ED     Status: None   Collection Time: 10/29/18 12:43 AM  Result Value Ref Range   I-stat hCG, quantitative <5.0 <5 mIU/mL   Comment 3          Lactic acid, plasma     Status: None   Collection Time: 10/29/18  1:18 AM  Result Value Ref Range   Lactic Acid, Venous 1.2 0.5 - 1.9 mmol/L  Urinalysis, Routine w reflex microscopic     Status: Abnormal   Collection Time: 10/29/18  4:01 AM  Result Value Ref Range   Color, Urine STRAW (A) YELLOW   APPearance CLEAR CLEAR   Specific Gravity, Urine >1.046 (H) 1.005 - 1.030   pH 5.0 5.0 - 8.0   Glucose, UA NEGATIVE NEGATIVE mg/dL   Hgb urine dipstick NEGATIVE NEGATIVE   Bilirubin Urine NEGATIVE NEGATIVE   Ketones, ur 20 (A) NEGATIVE mg/dL   Protein, ur NEGATIVE NEGATIVE mg/dL   Nitrite NEGATIVE NEGATIVE   Leukocytes,Ua NEGATIVE NEGATIVE  SARS Coronavirus 2 (CEPHEID - Performed in [facility] hospital lab), Hosp Order     Status: None   Collection Time: 10/29/18  4:46 AM   Specimen: Nasopharyngeal Swab  Result Value Ref Range   SARS Coronavirus 2 NEGATIVE NEGATIVE  Lactic acid, plasma     Status: None   Collection Time: 10/29/18  5:07 AM  Result Value Ref Range   Lactic Acid, Venous 0.8 0.5 - 1.9 mmol/L    Ct Abdomen Pelvis W Contrast  Result Date: 10/29/2018 CLINICAL DATA:  "Sudden onset RUQ/LUQ abdominal pain with radiation to right shoulder, nausea. +RUQ tenderness on exam, leukocytosis. highest concern for gallbladder issues, but also periumbilical tenderness. EXAM: CT ABDOMEN AND PELVIS WITH CONTRAST TECHNIQUE: Multidetector CT imaging of the abdomen and pelvis was performed using the standard protocol following bolus administration of intravenous contrast. CONTRAST:  148mL OMNIPAQUE IOHEXOL 300 MG/ML  SOLN COMPARISON:  None. FINDINGS: Lower chest: Mild hypoventilatory changes at the bases. Hepatobiliary: No focal liver abnormality is seen.  No gallstones, gallbladder wall thickening, or biliary dilatation. Pancreas: No ductal dilatation or inflammation. Spleen: Normal in size without focal abnormality. Small cleft posteriorly. Adrenals/Urinary Tract: Adrenal glands are unremarkable. Kidneys are normal, without renal calculi, focal lesion, or hydronephrosis. Bladder is unremarkable. Stomach/Bowel: Heterogeneous fluid in both paracolic gutters, as well as in the pelvis, in conjunction with lack of enteric contrast limits bowel assessment. Stomach is nondistended. No bowel obstruction. The appendix is not confidently visualized. Small volume of colonic stool without colonic inflammation. Vascular/Lymphatic: Heterogeneous fluid throughout the abdomen and pelvis suspicious for blood. No evidence of active extravasation.  Abdominal aorta is normal in caliber. Portal vein is patent. Reproductive: 18 mm low-density structure in the right adnexa with surrounding soft tissue density. More inferiorly is a probable 16 mm peripherally enhancing corpus luteal cyst. Heterogeneous ill-defined density in the left adnexa. Uterus is anteverted and not well-defined. Other: Large volume of heterogeneous fluid in the abdomen and pelvis consistent with blood. Source felt to be the pelvis/adnexa, with fluid tracking into the bilateral pericolic gutters, small bowel mesentery and upper quadrants. No free intra-abdominal air. No loculated abscess. Musculoskeletal: There are no acute or suspicious osseous abnormalities. IMPRESSION: 1. Large volume of heterogeneous fluid in the abdomen and pelvis consistent with blood. Source felt to be the pelvis/adnexa. There is a peripherally enhancing cyst in the right adnexa, with CT appearance suggestive of corpus luteum. Additional 18 mm cystic density in the right adnexa. Differential considerations include ruptured hemorrhagic ovarian cyst versus ectopic pregnancy, however patient had a negative beta HCG today in the ER which would make  ectopic pregnancy unlikely. Recommend GYN consultation. 2. Normal CT appearance of gallbladder. Appendix not visualized, however appearance is not suggestive of ruptured appendicitis. These results were called by telephone at the time of interpretation on 10/29/2018 at 2:43 am to [facility] , who verbally acknowledged these results. Electronically Signed   By: Narda RutherfordMelanie  Sanford M.D.   On: 10/29/2018 02:43    Assessment/Plan: 33yo G0 female with acute abdomen, hemodynamically stable Pt counseled to possible causes andfindings on exam that support them Offered diagnostic laparoscopy with possible removal of ectopic pregnancy, possible ovarian cystectomy, possible oopherectomy, possible open Post op expectations reviewed All questions answered Unasyn 1.5mg  IV Consent verified To OR when ready  Janean SarkCecilia W  10/29/2018, 6:31 AM

## 2018-10-29 NOTE — Op Note (Signed)
Operative Note    Preoperative Diagnosis 1. Acute abdomen 2. Bilateral adnexal mass   Postoperative Diagnosis 1. Acute abdomen due to hematopelvis from ruptured left ovarian cyst 2. Right ovarian cyst 3. Right fallopian tube cyst  Procedure: Diagnostic laparoscopy with evacuation of clots from pelvis, right fallopian tube and ovarian cystectomy   Surgeon: Mickle Mallory DO  2nd surgeon : Dr Johney Maine MD ( general surgeon)   Anesthesia: General  Fluids: LR 1444ml EBL: 231ml UOP: 32ml   Findings: Moderate to large amount of blood and clots in pelvis/abdomen. Hemostatic ruptured ovarian cyst on left, 2cm clear fallopian tube on right, 3.5cm right ovarian cyst, grossly normal uterus and left fallopian tube Grossly normal appendix; normal bowel    Specimen: right ovarian cyst and cyst wall   Procedure Note Consent reviewed and confirmed in periop area. Patient was taken to the operating room where general anesthesia was obtained without difficulty. She was then prepped and draped in the normal sterile fashion after placed the dorsal lithotomy position. An appropriate timeout was performed. A speculum was then placed within the vagina and a Clearvue manipulator placed within the cervix for uterine manipulation. Attention was then turned to the patient's abdomen after draping. A 5 mm infraumbilical incision was made and the optiview trocar and camera easily introduced into the abdominal cavity.Gas flow was then applied and a pneumoperitoneum obtained with approximate 3 L of CO2 gas. There was dark blood noted in abdomen; no entry injury.  A 44mm trocar was placed in the patients left lower quadrant under visualization and a better survey done.   With patient in Trendelenburg the uterus and tubes and ovaries were inspected. There was no active bleeding noted after blood and clots were cleared. The left ovary appeared to have had a recent cyst rupture -  Hemostatic resolution noted. The right  fallopian tube had a 2cm cyst with clear fluid that ws excised  and the right ovary also had a clear appearing 3.5cm cyst.  An intra operative consult with general surgeon, Dr Johney Maine, was requested due to mass noted in bowel near appendix. Dr Johney Maine scrubbed in , run the bowel and inspected the noted site as unremarkable. No bleeding was noted from site.  At this time, the right ovarian cyst and wall were excised with hemostasis noted after initial bleeding. After irrigating the pelvis and confirming no bleeding, an interceed was applied to the right ovary.  At this point, the surgery was concluded. A carter thomasen ws used to close the 75mm port site and rest of trocars removed with visualization. Gas reduced. Incisions were closed with 4-0 monocryl, dermabond and dressing. The clearvue manipulator was removed with intact cervix noted.  The bladder was emptied with a red rubber. Patient was then awakened and taken to the recovery room in good condition. Counts were all confirmed to be correct x 2 per nursing

## 2018-10-29 NOTE — ED Notes (Signed)
Bed: WA09 Expected date:  Expected time:  Means of arrival:  Comments: Room 5 

## 2018-10-29 NOTE — Anesthesia Postprocedure Evaluation (Signed)
Anesthesia Post Note  Patient: Herma L Paulick  Procedure(s) Performed: LAPAROSCOPY DIAGNOSTIC,right OVARIAN CYSTECTOMY right fallopian tube cystectomy (N/A Abdomen)     Patient location during evaluation: PACU Anesthesia Type: General Level of consciousness: awake and alert Pain management: pain level controlled Vital Signs Assessment: post-procedure vital signs reviewed and stable Respiratory status: spontaneous breathing, nonlabored ventilation, respiratory function stable and patient connected to nasal cannula oxygen Cardiovascular status: blood pressure returned to baseline and stable Postop Assessment: no apparent nausea or vomiting Anesthetic complications: no    Last Vitals:  Vitals:   10/29/18 0930 10/29/18 1030  BP: 131/72   Pulse: 92   Resp: 14   Temp:  37 C  SpO2: 100%     Last Pain:  Vitals:   10/29/18 1030  TempSrc:   PainSc: 1                  Tiajuana Amass

## 2018-10-29 NOTE — Discharge Summary (Signed)
Physician Discharge Summary  Patient ID: Emma Hogan MRN: [id number] DOB/AGE: 33-May-1987 33 y.o.  Admit date: 10/29/2018 Discharge date: 10/29/2018  Admission Diagnoses:Acute abdomen   Discharge Diagnoses: S/p Diagnostic laparoscopy with evacuation of clots from pelvis, right fallopian tube and ovarian cystectomy du to ruptured ovarian cyst    Principal Problem:   Hemorrhagic ovarian cyst Active Problems:   Lower abdominal pain   Acute abdomen   S/P laparoscopy   Discharged Condition: stable  Hospital Course: Admitted for diagnostic scope. Pt recovered well in PACU.Stable for discharge once cleared from PACU  Consults: general surgery  Significant Diagnostic Studies: radiology: CT scan: large blood in abdomen, bilateral adnexal masses  Treatments: surgery: Diagnostic laparoscopy with evacuation of clots from pelvis, right fallopian tube and ovarian cystectomy     Discharge Exam: Blood pressure 131/72, pulse 92, temperature 98.6 F (37 C), resp. rate 14, height 5\' 3"  (1.6 m), weight 62.6 kg, SpO2 100 %. General appearance: alert, cooperative and no distress  Disposition: Discharge disposition: 01-Home or Self Care       Discharge Instructions    Call MD for:  difficulty breathing, headache or visual disturbances   Complete by: As directed    Call MD for:  persistant nausea and vomiting   Complete by: As directed    Call MD for:  redness, tenderness, or signs of infection (pain, swelling, redness, odor or green/yellow discharge around incision site)   Complete by: As directed    Call MD for:  severe uncontrolled pain   Complete by: As directed    Call MD for:  temperature >100.4   Complete by: As directed    Diet - low sodium heart healthy   Complete by: As directed    Increase activity slowly   Complete by: As directed      Allergies as of 10/29/2018   No Known Allergies     Medication List    TAKE these medications   ibuprofen 600 MG  tablet Commonly known as: ADVIL Take 1 tablet (600 mg total) by mouth every 6 (six) hours as needed for moderate pain or cramping.   levocetirizine 5 MG tablet Commonly known as: XYZAL Take 5 mg by mouth daily as needed for allergies.   oxyCODONE-acetaminophen 5-325 MG tablet Commonly known as: Percocet Take 1 tablet by mouth every 4 (four) hours as needed for up to 7 days for severe pain.      Follow-up Information    Schedule an appointment as soon as possible for a visit with Sherlyn Hay, DO.   Specialty: Obstetrics and Gynecology Why: Come to office in 7-14 days for post op visit Contact information: [place] [address] [phone]           Signed: Isaiah Serge 10/29/2018, 9:47 AM

## 2018-10-29 NOTE — Op Note (Addendum)
PATIENT:  Emma Hogan  33 y.o. female  Patient Care Team: Patient, No Pcp Per as PCP - General (General Practice)  PRE-OPERATIVE DIAGNOSIS:  ABDOMINAL PAIN, probable ruptured ovarian cyst versus ectopic pregnancy  POST-OPERATIVE DIAGNOSIS: Hemorrhagic right ovarian chocolate cyst  PROCEDURE:   LAPAROSCOPY DIAGNOSTIC  SURGEON:  Ardeth SportsmanSteven C. Khalifa Knecht, MD  ASSIST:  Pryor OchoaecIlia Banga, MD   ANESTHESIA:   local and general  EBL:  Total I/O In: 1000 [I.V.:1000] Out: -   Delay start of Pharmacological VTE agent (>24hrs) due to surgical blood loss or risk of bleeding:  no  DRAINS: none   SPECIMEN: Right ovarian chocolate cyst  DISPOSITION OF SPECIMEN:  PATHOLOGY  COUNTS:  YES  PLAN OF CARE: Patient return to primary gynecological surgeon, Dr. Mindi SlickerBanga, in the OR  PATIENT DISPOSITION:  Operating room.  Intubated and stable   INDICATIONS: Patient with severe abdominal pain and lower abdomen.  Came to the emergency room.  Work-up suspicious for a right adnexal pathology.  Probable ruptured ovarian cyst.  Ectopic pregnancy less likely as husband has vasectomy.  Seen by Dr Mindi SlickerBanga with gynecology and recommendation made for urgent laparoscopic exploration.  See her note for details.  Intraoperative consultation requested to ensure no other pathology noted aside from right ovarian hemorrhagic/chocolate cyst.  OR FINDINGS: No evidence of appendicitis.  No Meckel's diverticulum.  No hernia.  No ileitis or Crohn's disease.  No inflammation.  No peritonitis.  No diverticulitis.  No bowel obstruction.  No adhesions.  Enlarged right ovary with some inflammation and reddish-brown cystic mass consistent with inflamed or hemorrhagic ovarian chocolate cyst.  I helped Dr. Mindi SlickerBanga on excision of that cyst.  DESCRIPTION:   Patient was already intubated in lithotomy position per Dr. Mindi SlickerBanga.  Please see her OR note.  She had an 11 mm port in the left lateral abdomen.  5 mm port periumbilically.  I placed  another 5 mm port in the right paramedian region under laparoscopic guidance.  I proceed with diagnostic laparoscopy.  I agreed that patient had an enlarged right ovary with central purplish discoloration suspicious for an inflamed chocolate ovarian cyst.  No obvious ectopic pregnancy.  Both fallopian tubes and left ovary seemed normal and not inflamed.  No purulence or phlegmon.  Uterus not enlarged without any fibroids.  Noninflamed.  I explored the intraperitoneal rectum at the peritoneal reflection and cul-de-sac.  No inflammation or perforation.  No inguinal hernias or femoral hernias.  Came proximally to confirm that the sigmoid colon which was soft and mobile without any evidence of diverticulitis.  I could easily identify the appendix resting naturally over the right psoas muscle.  Gentle curve and not inflamed.  No phlegmon or irritation.  The cecum and ascending colon was soft without any inflammation.  I ran the small bowel proximally from the ileocecal valve to the proximal jejunum at the ligament of Treitz.  There is no evidence of Crohn's disease, Meckel's diverticulum, interloop adhesions, inflammation.   Examination of the upper abdomen was underwhelming for any other possible etiologies.  There is no peritonitis.   Based on the lack of evidence of any other etiology of the pain, Dr. Mindi SlickerBanga went ahead and proceeded with resection of part of the right ovary to unroof and at least partially remove the inflamed chocolate cyst using Harmonic ultrasonic dissection and resection, making an effort to preserve at least part of the right ovary that was noninflamed.  I assisted on this.  Please see her note for  details.  Hemostasis was good on the remaining part of the right ovary.  No other abnormalities.  This point I returned the case to Dr. Terri Piedra.  See her note for details.  Disposition per her.  Adin Hector, M.D., F.A.C.S. Gastrointestinal and Minimally Invasive Surgery [facility] [address] [place], [place] [address] [phone] Main / Paging  10/29/2018 8:47 AM

## 2018-10-29 NOTE — Transfer of Care (Signed)
Immediate Anesthesia Transfer of Care Note  Patient: Emma Hogan  Procedure(s) Performed: LAPAROSCOPY DIAGNOSTIC,right OVARIAN CYSTECTOMY right fallopian tube cystectomy (N/A Abdomen)  Patient Location: PACU  Anesthesia Type:General  Level of Consciousness: awake and alert   Airway & Oxygen Therapy: Patient Spontanous Breathing and Patient connected to face mask oxygen  Post-op Assessment: Report given to RN and Post -op Vital signs reviewed and stable  Post vital signs: Reviewed and stable  Last Vitals:  Vitals Value Taken Time  BP 127/71 10/29/18 0927  Temp    Pulse 92 10/29/18 0929  Resp 30 10/29/18 0929  SpO2 100 % 10/29/18 0929  Vitals shown include unvalidated device data.  Last Pain:  Vitals:   10/29/18 0508  TempSrc:   PainSc: 5          Complications: No apparent anesthesia complications

## 2018-10-29 NOTE — ED Notes (Signed)
Pt taken to CT.

## 2018-10-29 NOTE — Progress Notes (Signed)
PACU NURSING NOTE: Pt ready for PACU DC to home per MD orders, pt able to tolerate PO fluids, voided w/o difficulty, was able to ambulate and stand w/o issues. AVS sheet was printed and post op instructions from AVS sheet and Anesthesia Post General Anesthesia Care was also reviewed with pt and husband. Discussed when to call MD on call, also discussed General Anesthesia post op diet. Stressed importance of safety while taking PO pain med. Instructed pt to perform strenuous activity or lifting until follow up appt was completed with primary MD. Opportunity for questions provided multiple times prior to pt being DC to home with husband. All personal belongings and pts ring from security was retrieved and provided to pt.

## 2018-10-29 NOTE — ED Notes (Addendum)
Pt dropped off in PACU, key for security lock box given to [place], [place] in PACU.

## 2018-10-29 NOTE — Anesthesia Preprocedure Evaluation (Addendum)
Anesthesia Evaluation  Patient identified by MRN, date of birth, ID band Patient awake    Reviewed: Allergy & Precautions, NPO status , Patient's Chart, lab work & pertinent test results  Airway Mallampati: II  TM Distance: >3 FB Neck ROM: Full    Dental no notable dental hx. (+) Teeth Intact, Dental Advisory Given   Pulmonary neg pulmonary ROS,    Pulmonary exam normal breath sounds clear to auscultation       Cardiovascular negative cardio ROS Normal cardiovascular exam Rhythm:Regular Rate:Normal     Neuro/Psych negative neurological ROS  negative psych ROS   GI/Hepatic negative GI ROS, Neg liver ROS,   Endo/Other  negative endocrine ROS  Renal/GU negative Renal ROS  negative genitourinary   Musculoskeletal negative musculoskeletal ROS (+)   Abdominal   Peds  Hematology negative hematology ROS (+) Hgb 11.5   Anesthesia Other Findings Ectopic pregnancy  Reproductive/Obstetrics                            Anesthesia Physical Anesthesia Plan  ASA: I and emergent  Anesthesia Plan: General   Post-op Pain Management:    Induction: Intravenous and Rapid sequence  PONV Risk Score and Plan: 3 and Midazolam, Dexamethasone and Ondansetron  Airway Management Planned: Oral ETT  Additional Equipment:   Intra-op Plan:   Post-operative Plan: Extubation in OR  Informed Consent: I have reviewed the patients History and Physical, chart, labs and discussed the procedure including the risks, benefits and alternatives for the proposed anesthesia with the patient or authorized representative who has indicated his/her understanding and acceptance.     Dental advisory given  Plan Discussed with: CRNA  Anesthesia Plan Comments:         Anesthesia Quick Evaluation

## 2018-10-29 NOTE — ED Notes (Signed)
This NT obtained pt's valuables (1 wedding ring) with security in the [facility]. Pt is aware her ring is locked up and signed for her ring to be kept there during surgery. Will hand off key for the lock box to oncoming RN in PACU.

## 2018-10-29 NOTE — ED Notes (Signed)
Called PACU to advise COVID negative. PACU said to call [facility] in [place]. Call disconnected. Tech transporting patient to OR.

## 2018-10-29 NOTE — ED Triage Notes (Signed)
Pt reports increasing abdominal pain along with dizziness.

## 2018-10-29 NOTE — Anesthesia Procedure Notes (Signed)
Procedure Name: Intubation Date/Time: 10/29/2018 7:23 AM Performed by: Cynda Familia, CRNA Pre-anesthesia Checklist: Patient identified, Emergency Drugs available, Suction available and Patient being monitored Patient Re-evaluated:Patient Re-evaluated prior to induction Oxygen Delivery Method: Circle System Utilized Preoxygenation: Pre-oxygenation with 100% oxygen Induction Type: IV induction, Cricoid Pressure applied and Rapid sequence Ventilation: Mask ventilation without difficulty Laryngoscope Size: Miller and 2 Grade View: Grade I Tube type: Oral Number of attempts: 1 Airway Equipment and Method: Stylet and Oral airway Placement Confirmation: ETT inserted through vocal cords under direct vision,  positive ETCO2 and breath sounds checked- equal and bilateral Secured at: 22 cm Tube secured with: Tape Dental Injury: Teeth and Oropharynx as per pre-operative assessment  Comments: Smooth RSI Ola Spurr- -intubation AM CRNA atraumatic-- teeth and mouth as preop-- bilat BS Fitzgerlad

## 2018-10-29 NOTE — ED Provider Notes (Signed)
[facility]-EMERGENCY DEPT Provider Note   CSN: [id number] Arrival date & time: 10/28/18  2349    History   Chief Complaint Chief Complaint  Patient presents with   Abdominal Pain    HPI Emma Hogan is a 33 y.o. female with no PMH presents to the ER for evaluation of pain to bilateral lower rib cages R>L with radiation to her right shoulder, right bicep, lower abdomen.  Onset around 5 PM, initially mild but has since significantly worsened, described as 10/10, sharp pain.  Intermittent.  The pain is making her take shallow breaths.  Pain is worse with movement, deep breathing, sitting up.  No interventions for this.  States an hour before the pain began she felt constipated.  She walked to the bathroom and felt felt lightheaded, broke out in sweats, cold chills, nausea and felt like her vision got dark/tunnel vision.  She had one large volume non bloody non melenoic episode of diarrhea around 4 PM.  Since, her lower abdomen has been mildly sore.  Reports some shortness of breath that she attributes to pain with breathing.  She had some wine and a penny for lunch and initially thought that this made her constipated and have diarrhea.  Denies any fever, vomiting, syncope, blood in stool, melena.  No history of kidney stones.  No recent dysuria, hematuria, urinary frequency or urgency.  History of ovarian cysts.  No abdominal surgeries.  No history of DVT/PE.  No recent surgeries, prolonged immobilization, hemoptysis, hormone therapy, lower extremity edema or calf pain.    HPI  History reviewed. No pertinent past medical history.  There are no active problems to display for this patient.   History reviewed. No pertinent surgical history.   OB History   No obstetric history on file.      Home Medications    Prior to Admission medications   Medication Sig Start Date End Date Taking? Authorizing Provider  levocetirizine (XYZAL) 5 MG tablet Take 5 mg by  mouth daily as needed for allergies.   Yes [provider]    Family History No family history on file.  Social History Social History   Tobacco Use   Smoking status: Never Smoker   Smokeless tobacco: Never Used  Substance Use Topics   Alcohol use: Never    Frequency: Never   Drug use: Never     Allergies   Patient has no known allergies.   Review of Systems Review of Systems  Constitutional: Positive for diaphoresis.  Cardiovascular:       Low bilateral rib cage pain R>L  Gastrointestinal: Positive for abdominal pain, diarrhea (x1) and nausea.  Neurological: Positive for light-headedness.  All other systems reviewed and are negative.    Physical Exam Updated Vital Signs BP (!) 107/55    Pulse 78    Temp 98.4 F (36.9 C) (Oral)    Resp 18    Ht 5\' 3"  (1.6 m)    Wt 62.6 kg    SpO2 99%    BMI 24.45 kg/m   Physical Exam Vitals signs and nursing note reviewed.  Constitutional:      Appearance: She is well-developed.     Comments: Nontoxic but looks uncomfortable.  HENT:     Head: Normocephalic and atraumatic.     Nose: Nose normal.  Eyes:     Conjunctiva/sclera: Conjunctivae normal.  Neck:     Musculoskeletal: Normal range of motion.  Cardiovascular:     Rate and Rhythm:  Normal rate and regular rhythm.     Comments: 1+ radial and DP pulses bilaterally.  No lower extremity edema.  No calf tenderness. Pulmonary:     Effort: Pulmonary effort is normal.     Breath sounds: Normal breath sounds.     Comments: No rib cage tenderness.  Pain noted with deep inspiration, movements. Abdominal:     General: Bowel sounds are normal.     Palpations: Abdomen is soft.     Tenderness: There is abdominal tenderness in the right upper quadrant, right lower quadrant, epigastric area, periumbilical area and suprapubic area. There is guarding.     Comments: Soft, flat.  Moderate diffuse tenderness and guarding, worst in the epigastrium, RUQ, periumbilical and  suprapubic areas.  Positive Murphy's. No CVA tenderness. Positive McBurney's. Active BS to lower quadrants. No distention.   Musculoskeletal: Normal range of motion.  Skin:    General: Skin is warm and dry.     Capillary Refill: Capillary refill takes less than 2 seconds.  Neurological:     Mental Status: She is alert.     Comments: Sensation and strength intact in upper/lower extremities  Psychiatric:        Behavior: Behavior normal.      ED Treatments / Results  Labs (all labs ordered are listed, but only abnormal results are displayed) Labs Reviewed  COMPREHENSIVE METABOLIC PANEL - Abnormal; Notable for the following components:      Result Value   CO2 20 (*)    Glucose, Bld 139 (*)    Calcium 8.7 (*)    All other components within normal limits  CBC - Abnormal; Notable for the following components:   WBC 16.3 (*)    Hemoglobin 11.5 (*)    HCT 35.6 (*)    All other components within normal limits  URINALYSIS, ROUTINE W REFLEX MICROSCOPIC - Abnormal; Notable for the following components:   Color, Urine STRAW (*)    Specific Gravity, Urine >1.046 (*)    Ketones, ur 20 (*)    All other components within normal limits  SARS CORONAVIRUS 2 (HOSPITAL ORDER, [place] [facility])  LIPASE, BLOOD  LACTIC ACID, PLASMA  LACTIC ACID, PLASMA  I-STAT BETA HCG BLOOD, ED ([facility], [facility], [facility] ONLY)    EKG EKG Interpretation  Date/Time:  Sunday October 29 2018 01:28:17 EDT Ventricular Rate:  69 PR Interval:    QRS Duration: 96 QT Interval:  456 QTC Calculation: 489 R Axis:   97 Text Interpretation:  Sinus rhythm Borderline right axis deviation Borderline prolonged QT interval No previous tracing Confirmed by Orpah Greek [phone]) on 10/29/2018 1:42:04 AM   Radiology Ct Abdomen Pelvis W Contrast  Result Date: 10/29/2018 CLINICAL DATA:  "Sudden onset RUQ/LUQ abdominal pain with radiation to right shoulder, nausea. +RUQ tenderness on exam, leukocytosis. highest  concern for gallbladder issues, but also periumbilical tenderness. EXAM: CT ABDOMEN AND PELVIS WITH CONTRAST TECHNIQUE: Multidetector CT imaging of the abdomen and pelvis was performed using the standard protocol following bolus administration of intravenous contrast. CONTRAST:  179mL OMNIPAQUE IOHEXOL 300 MG/ML  SOLN COMPARISON:  None. FINDINGS: Lower chest: Mild hypoventilatory changes at the bases. Hepatobiliary: No focal liver abnormality is seen. No gallstones, gallbladder wall thickening, or biliary dilatation. Pancreas: No ductal dilatation or inflammation. Spleen: Normal in size without focal abnormality. Small cleft posteriorly. Adrenals/Urinary Tract: Adrenal glands are unremarkable. Kidneys are normal, without renal calculi, focal lesion, or hydronephrosis. Bladder is unremarkable. Stomach/Bowel: Heterogeneous fluid in both paracolic  gutters, as well as in the pelvis, in conjunction with lack of enteric contrast limits bowel assessment. Stomach is nondistended. No bowel obstruction. The appendix is not confidently visualized. Small volume of colonic stool without colonic inflammation. Vascular/Lymphatic: Heterogeneous fluid throughout the abdomen and pelvis suspicious for blood. No evidence of active extravasation. Abdominal aorta is normal in caliber. Portal vein is patent. Reproductive: 18 mm low-density structure in the right adnexa with surrounding soft tissue density. More inferiorly is a probable 16 mm peripherally enhancing corpus luteal cyst. Heterogeneous ill-defined density in the left adnexa. Uterus is anteverted and not well-defined. Other: Large volume of heterogeneous fluid in the abdomen and pelvis consistent with blood. Source felt to be the pelvis/adnexa, with fluid tracking into the bilateral pericolic gutters, small bowel mesentery and upper quadrants. No free intra-abdominal air. No loculated abscess. Musculoskeletal: There are no acute or suspicious osseous abnormalities. IMPRESSION:  1. Large volume of heterogeneous fluid in the abdomen and pelvis consistent with blood. Source felt to be the pelvis/adnexa. There is a peripherally enhancing cyst in the right adnexa, with CT appearance suggestive of corpus luteum. Additional 18 mm cystic density in the right adnexa. Differential considerations include ruptured hemorrhagic ovarian cyst versus ectopic pregnancy, however patient had a negative beta HCG today in the ER which would make ectopic pregnancy unlikely. Recommend GYN consultation. 2. Normal CT appearance of gallbladder. Appendix not visualized, however appearance is not suggestive of ruptured appendicitis. These results were called by telephone at the time of interpretation on 10/29/2018 at 2:43 am to [facility] Iasha Mccalister , who verbally acknowledged these results. Electronically Signed   By: Narda RutherfordMelanie  Sanford M.D.   On: 10/29/2018 02:43    Procedures .Critical Care Performed by: Liberty HandyGibbons, Alzora Ha J, PA-C Authorized by: Liberty HandyGibbons, Mohamedamin Nifong J, PA-C   Critical care provider statement:    Critical care time (minutes):  45   Critical care was necessary to treat or prevent imminent or life-threatening deterioration of the following conditions: pelvic bleeding into abdominal cavity requiring emergent diagnostic GYN scope.   Critical care was time spent personally by me on the following activities:  Discussions with consultants, evaluation of patient's response to treatment, examination of patient, ordering and performing treatments and interventions, ordering and review of laboratory studies, ordering and review of radiographic studies, pulse oximetry, re-evaluation of patient's condition, obtaining history from patient or surrogate and review of old charts   I assumed direction of critical care for this patient from another provider in my specialty: no     (including critical care time)  Medications Ordered in ED Medications  sodium chloride (PF) 0.9 % injection (has no administration in  time range)  sodium chloride flush (NS) 0.9 % injection 3 mL (3 mLs Intravenous Given 10/29/18 0037)  sodium chloride 0.9 % bolus 1,000 mL (0 mLs Intravenous Stopped 10/29/18 0220)  ondansetron (ZOFRAN) injection 4 mg (4 mg Intravenous Given 10/29/18 0116)  fentaNYL (SUBLIMAZE) injection 50 mcg (50 mcg Intravenous Given 10/29/18 0116)  iohexol (OMNIPAQUE) 300 MG/ML solution 100 mL (100 mLs Intravenous Contrast Given 10/29/18 0219)  morphine 4 MG/ML injection 4 mg (4 mg Intravenous Given 10/29/18 0346)     Initial Impression / Assessment and Plan / ED Course  I have reviewed the triage vital signs and the nursing notes.  Pertinent labs & imaging results that were available during my care of the patient were reviewed by me and considered in my medical decision making (see chart for details).  Clinical Course as of Jun 21  [id number]  Sun Oct 29, 2018  0148 Borderline prolonged Qtc 489   [CG]  0148 WBC(!): 16.3 [CG]  0259 . Large volume of heterogeneous fluid in the abdomen and pelvis consistent with blood. Source felt to be the pelvis/adnexa. There is a peripherally enhancing cyst in the right adnexa, with CT appearance suggestive of corpus luteum. Additional 18 mm cystic density in the right adnexa. Differential considerations include ruptured hemorrhagic ovarian cyst versus ectopic pregnancy, however patient had a negative beta HCG today in the ER which would make ectopic pregnancy unlikely. Recommend GYN consultation. 2. Normal CT appearance of gallbladder. Appendix not visualized, however appearance is not suggestive of ruptured appendicitis.   CT ABDOMEN PELVIS W CONTRAST [CG]  0321 18 mm low-density structure in the right adnexa with surrounding soft tissue density. Uterus is anteverted and not well-defined.  Large volume of heterogeneous fluid in the abdomen and pelvis consistent with blood. Source felt to be the pelvis/adnexa, with fluid tracking into the bilateral pericolic gutters, small  bowel mesentery and upper quadrants. No free intra-abdominal air. No loculated abscess.  CT ABDOMEN PELVIS W CONTRAST [CG]  [phone] Spoke to Dr Mindi SlickerBanga who recommends diagnostic scope. [facility] Discussed plan with pt who is in agreement. VSS.   [CG]    Clinical Course User Index [CG] Liberty HandyGibbons, Jihan Rudy J, PA-C     33 year old with history of ovarian cyst presents for diffuse severe abdominal pain, lightheadedness, nausea.  Some radiation to the right shoulder.  Recent diarrhea.  Exam reveals generalized tenderness and guarding.  She looks uncomfortable.  HD stable.  Afebrile.  DDX includes cholecystitis versus appendicitis versus viral gastroenteritis.  She has no urinary symptoms.  No vaginal symptoms.  Work-up reveals WBC 16.3, afebrile without lactic acidosis.  VS WNL.  This may be reactive from pain, diarrhea.  CT A/P reveals large volume blood in the abdomen pelvis tracking into bilateral paracolic gutters, upper quadrants, SB mesentery.  Source of bleeding appears to be in the adnexa.  Patient has history of previous ruptured ovarian cyst.  [facility] pending consult.  She is still in significant amount of pain.  Hemoglobin 11.5.  Will repeat pain medicine.  Final Clinical Impressions(s) / ED Diagnoses   [phone]: Patient discussed with OBGYN Dr Mindi SlickerBanga who has reviewed her chart. Recommends diagnostic scope. Hcg negative.  Pt to have procedure today. Discussed POC with patient. Pending COVID for procedure.   0550: COVID negative. Called Dr Mindi SlickerBanga who will come see patient in ER, take to OR. VSS.  Final diagnoses:  Bleeding behind the abdominal cavity    ED Discharge Orders    None       Jerrell MylarGibbons, Cheick Suhr J, PA-C 10/29/18 0554    Gilda CreasePollina, Christopher J, MD 10/30/18 0230

## 2018-10-29 NOTE — ED Notes (Signed)
After getting vital signs, pt was calling out for help. She was found laying supine on the floor. She said she felt dizzy and had to lay flat on the ground. Pt said she did not fall or have an injury. She was assisted back into the chair.

## 2018-10-30 ENCOUNTER — Encounter (HOSPITAL_COMMUNITY): Payer: Self-pay | Admitting: Obstetrics and Gynecology

## 2018-10-31 DIAGNOSIS — R319 Hematuria, unspecified: Secondary | ICD-10-CM | POA: Diagnosis not present

## 2018-11-02 LAB — TYPE AND SCREEN
ABO/RH(D): B POS
Antibody Screen: NEGATIVE
Unit division: 0
Unit division: 0

## 2018-11-02 LAB — BPAM RBC
Blood Product Expiration Date: 202007142359
Blood Product Expiration Date: 202007192359
Unit Type and Rh: 7300
Unit Type and Rh: 7300

## 2018-11-03 DIAGNOSIS — J3081 Allergic rhinitis due to animal (cat) (dog) hair and dander: Secondary | ICD-10-CM | POA: Diagnosis not present

## 2018-11-03 DIAGNOSIS — J301 Allergic rhinitis due to pollen: Secondary | ICD-10-CM | POA: Diagnosis not present

## 2018-11-03 DIAGNOSIS — J3089 Other allergic rhinitis: Secondary | ICD-10-CM | POA: Diagnosis not present

## 2018-11-06 DIAGNOSIS — J3089 Other allergic rhinitis: Secondary | ICD-10-CM | POA: Diagnosis not present

## 2018-11-06 DIAGNOSIS — J301 Allergic rhinitis due to pollen: Secondary | ICD-10-CM | POA: Diagnosis not present

## 2018-11-06 DIAGNOSIS — J3081 Allergic rhinitis due to animal (cat) (dog) hair and dander: Secondary | ICD-10-CM | POA: Diagnosis not present

## 2018-11-13 DIAGNOSIS — J3081 Allergic rhinitis due to animal (cat) (dog) hair and dander: Secondary | ICD-10-CM | POA: Diagnosis not present

## 2018-11-13 DIAGNOSIS — J301 Allergic rhinitis due to pollen: Secondary | ICD-10-CM | POA: Diagnosis not present

## 2018-11-13 DIAGNOSIS — J3089 Other allergic rhinitis: Secondary | ICD-10-CM | POA: Diagnosis not present

## 2018-11-21 DIAGNOSIS — J3089 Other allergic rhinitis: Secondary | ICD-10-CM | POA: Diagnosis not present

## 2018-11-21 DIAGNOSIS — J301 Allergic rhinitis due to pollen: Secondary | ICD-10-CM | POA: Diagnosis not present

## 2018-11-23 DIAGNOSIS — Z0000 Encounter for general adult medical examination without abnormal findings: Secondary | ICD-10-CM | POA: Diagnosis not present

## 2018-11-27 DIAGNOSIS — J301 Allergic rhinitis due to pollen: Secondary | ICD-10-CM | POA: Diagnosis not present

## 2018-11-27 DIAGNOSIS — J3081 Allergic rhinitis due to animal (cat) (dog) hair and dander: Secondary | ICD-10-CM | POA: Diagnosis not present

## 2018-11-27 DIAGNOSIS — J3089 Other allergic rhinitis: Secondary | ICD-10-CM | POA: Diagnosis not present

## 2018-12-04 DIAGNOSIS — J301 Allergic rhinitis due to pollen: Secondary | ICD-10-CM | POA: Diagnosis not present

## 2018-12-04 DIAGNOSIS — J3089 Other allergic rhinitis: Secondary | ICD-10-CM | POA: Diagnosis not present

## 2018-12-04 DIAGNOSIS — J3081 Allergic rhinitis due to animal (cat) (dog) hair and dander: Secondary | ICD-10-CM | POA: Diagnosis not present

## 2018-12-06 DIAGNOSIS — J301 Allergic rhinitis due to pollen: Secondary | ICD-10-CM | POA: Diagnosis not present

## 2018-12-06 DIAGNOSIS — J3089 Other allergic rhinitis: Secondary | ICD-10-CM | POA: Diagnosis not present

## 2018-12-06 DIAGNOSIS — J3081 Allergic rhinitis due to animal (cat) (dog) hair and dander: Secondary | ICD-10-CM | POA: Diagnosis not present

## 2018-12-11 DIAGNOSIS — M9901 Segmental and somatic dysfunction of cervical region: Secondary | ICD-10-CM | POA: Diagnosis not present

## 2018-12-11 DIAGNOSIS — M791 Myalgia, unspecified site: Secondary | ICD-10-CM | POA: Diagnosis not present

## 2018-12-11 DIAGNOSIS — M9903 Segmental and somatic dysfunction of lumbar region: Secondary | ICD-10-CM | POA: Diagnosis not present

## 2018-12-11 DIAGNOSIS — M9902 Segmental and somatic dysfunction of thoracic region: Secondary | ICD-10-CM | POA: Diagnosis not present

## 2018-12-12 DIAGNOSIS — J3081 Allergic rhinitis due to animal (cat) (dog) hair and dander: Secondary | ICD-10-CM | POA: Diagnosis not present

## 2018-12-12 DIAGNOSIS — J301 Allergic rhinitis due to pollen: Secondary | ICD-10-CM | POA: Diagnosis not present

## 2018-12-12 DIAGNOSIS — J3089 Other allergic rhinitis: Secondary | ICD-10-CM | POA: Diagnosis not present

## 2018-12-18 DIAGNOSIS — J301 Allergic rhinitis due to pollen: Secondary | ICD-10-CM | POA: Diagnosis not present

## 2018-12-18 DIAGNOSIS — J3081 Allergic rhinitis due to animal (cat) (dog) hair and dander: Secondary | ICD-10-CM | POA: Diagnosis not present

## 2018-12-18 DIAGNOSIS — J3089 Other allergic rhinitis: Secondary | ICD-10-CM | POA: Diagnosis not present

## 2018-12-25 DIAGNOSIS — M9902 Segmental and somatic dysfunction of thoracic region: Secondary | ICD-10-CM | POA: Diagnosis not present

## 2018-12-25 DIAGNOSIS — M9901 Segmental and somatic dysfunction of cervical region: Secondary | ICD-10-CM | POA: Diagnosis not present

## 2018-12-25 DIAGNOSIS — M9903 Segmental and somatic dysfunction of lumbar region: Secondary | ICD-10-CM | POA: Diagnosis not present

## 2018-12-25 DIAGNOSIS — M791 Myalgia, unspecified site: Secondary | ICD-10-CM | POA: Diagnosis not present

## 2018-12-26 DIAGNOSIS — J3081 Allergic rhinitis due to animal (cat) (dog) hair and dander: Secondary | ICD-10-CM | POA: Diagnosis not present

## 2018-12-26 DIAGNOSIS — J301 Allergic rhinitis due to pollen: Secondary | ICD-10-CM | POA: Diagnosis not present

## 2018-12-26 DIAGNOSIS — J3089 Other allergic rhinitis: Secondary | ICD-10-CM | POA: Diagnosis not present

## 2019-01-02 DIAGNOSIS — J301 Allergic rhinitis due to pollen: Secondary | ICD-10-CM | POA: Diagnosis not present

## 2019-01-02 DIAGNOSIS — J3089 Other allergic rhinitis: Secondary | ICD-10-CM | POA: Diagnosis not present

## 2019-01-02 DIAGNOSIS — J3081 Allergic rhinitis due to animal (cat) (dog) hair and dander: Secondary | ICD-10-CM | POA: Diagnosis not present

## 2019-01-03 DIAGNOSIS — Z0000 Encounter for general adult medical examination without abnormal findings: Secondary | ICD-10-CM | POA: Diagnosis not present

## 2019-01-03 DIAGNOSIS — E7800 Pure hypercholesterolemia, unspecified: Secondary | ICD-10-CM | POA: Diagnosis not present

## 2019-01-04 DIAGNOSIS — J301 Allergic rhinitis due to pollen: Secondary | ICD-10-CM | POA: Diagnosis not present

## 2019-01-04 DIAGNOSIS — J3089 Other allergic rhinitis: Secondary | ICD-10-CM | POA: Diagnosis not present

## 2019-01-04 DIAGNOSIS — J3081 Allergic rhinitis due to animal (cat) (dog) hair and dander: Secondary | ICD-10-CM | POA: Diagnosis not present

## 2019-01-08 DIAGNOSIS — J3081 Allergic rhinitis due to animal (cat) (dog) hair and dander: Secondary | ICD-10-CM | POA: Diagnosis not present

## 2019-01-08 DIAGNOSIS — J301 Allergic rhinitis due to pollen: Secondary | ICD-10-CM | POA: Diagnosis not present

## 2019-01-08 DIAGNOSIS — J3089 Other allergic rhinitis: Secondary | ICD-10-CM | POA: Diagnosis not present

## 2019-01-10 DIAGNOSIS — J3089 Other allergic rhinitis: Secondary | ICD-10-CM | POA: Diagnosis not present

## 2019-01-10 DIAGNOSIS — J301 Allergic rhinitis due to pollen: Secondary | ICD-10-CM | POA: Diagnosis not present

## 2019-01-10 DIAGNOSIS — H1045 Other chronic allergic conjunctivitis: Secondary | ICD-10-CM | POA: Diagnosis not present

## 2019-01-10 DIAGNOSIS — J3081 Allergic rhinitis due to animal (cat) (dog) hair and dander: Secondary | ICD-10-CM | POA: Diagnosis not present

## 2019-01-16 DIAGNOSIS — J3081 Allergic rhinitis due to animal (cat) (dog) hair and dander: Secondary | ICD-10-CM | POA: Diagnosis not present

## 2019-01-16 DIAGNOSIS — J301 Allergic rhinitis due to pollen: Secondary | ICD-10-CM | POA: Diagnosis not present

## 2019-01-16 DIAGNOSIS — J3089 Other allergic rhinitis: Secondary | ICD-10-CM | POA: Diagnosis not present

## 2019-01-18 DIAGNOSIS — M791 Myalgia, unspecified site: Secondary | ICD-10-CM | POA: Diagnosis not present

## 2019-01-18 DIAGNOSIS — M9902 Segmental and somatic dysfunction of thoracic region: Secondary | ICD-10-CM | POA: Diagnosis not present

## 2019-01-18 DIAGNOSIS — M9903 Segmental and somatic dysfunction of lumbar region: Secondary | ICD-10-CM | POA: Diagnosis not present

## 2019-01-18 DIAGNOSIS — M9901 Segmental and somatic dysfunction of cervical region: Secondary | ICD-10-CM | POA: Diagnosis not present

## 2019-01-19 DIAGNOSIS — J301 Allergic rhinitis due to pollen: Secondary | ICD-10-CM | POA: Diagnosis not present

## 2019-01-19 DIAGNOSIS — J3081 Allergic rhinitis due to animal (cat) (dog) hair and dander: Secondary | ICD-10-CM | POA: Diagnosis not present

## 2019-01-19 DIAGNOSIS — J3089 Other allergic rhinitis: Secondary | ICD-10-CM | POA: Diagnosis not present

## 2019-01-23 DIAGNOSIS — J301 Allergic rhinitis due to pollen: Secondary | ICD-10-CM | POA: Diagnosis not present

## 2019-01-23 DIAGNOSIS — J3081 Allergic rhinitis due to animal (cat) (dog) hair and dander: Secondary | ICD-10-CM | POA: Diagnosis not present

## 2019-01-23 DIAGNOSIS — J3089 Other allergic rhinitis: Secondary | ICD-10-CM | POA: Diagnosis not present

## 2019-01-30 DIAGNOSIS — J3081 Allergic rhinitis due to animal (cat) (dog) hair and dander: Secondary | ICD-10-CM | POA: Diagnosis not present

## 2019-01-30 DIAGNOSIS — J3089 Other allergic rhinitis: Secondary | ICD-10-CM | POA: Diagnosis not present

## 2019-01-30 DIAGNOSIS — J301 Allergic rhinitis due to pollen: Secondary | ICD-10-CM | POA: Diagnosis not present

## 2019-02-06 DIAGNOSIS — J301 Allergic rhinitis due to pollen: Secondary | ICD-10-CM | POA: Diagnosis not present

## 2019-02-06 DIAGNOSIS — J3081 Allergic rhinitis due to animal (cat) (dog) hair and dander: Secondary | ICD-10-CM | POA: Diagnosis not present

## 2019-02-06 DIAGNOSIS — J3089 Other allergic rhinitis: Secondary | ICD-10-CM | POA: Diagnosis not present

## 2019-02-12 DIAGNOSIS — J3081 Allergic rhinitis due to animal (cat) (dog) hair and dander: Secondary | ICD-10-CM | POA: Diagnosis not present

## 2019-02-12 DIAGNOSIS — J301 Allergic rhinitis due to pollen: Secondary | ICD-10-CM | POA: Diagnosis not present

## 2019-02-12 DIAGNOSIS — J3089 Other allergic rhinitis: Secondary | ICD-10-CM | POA: Diagnosis not present

## 2019-02-20 DIAGNOSIS — M9901 Segmental and somatic dysfunction of cervical region: Secondary | ICD-10-CM | POA: Diagnosis not present

## 2019-02-20 DIAGNOSIS — M791 Myalgia, unspecified site: Secondary | ICD-10-CM | POA: Diagnosis not present

## 2019-02-20 DIAGNOSIS — J301 Allergic rhinitis due to pollen: Secondary | ICD-10-CM | POA: Diagnosis not present

## 2019-02-20 DIAGNOSIS — J3081 Allergic rhinitis due to animal (cat) (dog) hair and dander: Secondary | ICD-10-CM | POA: Diagnosis not present

## 2019-02-20 DIAGNOSIS — M9903 Segmental and somatic dysfunction of lumbar region: Secondary | ICD-10-CM | POA: Diagnosis not present

## 2019-02-20 DIAGNOSIS — M9902 Segmental and somatic dysfunction of thoracic region: Secondary | ICD-10-CM | POA: Diagnosis not present

## 2019-02-20 DIAGNOSIS — J3089 Other allergic rhinitis: Secondary | ICD-10-CM | POA: Diagnosis not present

## 2019-02-21 DIAGNOSIS — J3089 Other allergic rhinitis: Secondary | ICD-10-CM | POA: Diagnosis not present

## 2019-02-27 DIAGNOSIS — J301 Allergic rhinitis due to pollen: Secondary | ICD-10-CM | POA: Diagnosis not present

## 2019-02-27 DIAGNOSIS — J3081 Allergic rhinitis due to animal (cat) (dog) hair and dander: Secondary | ICD-10-CM | POA: Diagnosis not present

## 2019-02-27 DIAGNOSIS — J3089 Other allergic rhinitis: Secondary | ICD-10-CM | POA: Diagnosis not present

## 2019-03-06 DIAGNOSIS — J3089 Other allergic rhinitis: Secondary | ICD-10-CM | POA: Diagnosis not present

## 2019-03-06 DIAGNOSIS — J301 Allergic rhinitis due to pollen: Secondary | ICD-10-CM | POA: Diagnosis not present

## 2019-03-06 DIAGNOSIS — J3081 Allergic rhinitis due to animal (cat) (dog) hair and dander: Secondary | ICD-10-CM | POA: Diagnosis not present

## 2019-03-08 DIAGNOSIS — J3089 Other allergic rhinitis: Secondary | ICD-10-CM | POA: Diagnosis not present

## 2019-03-08 DIAGNOSIS — J3081 Allergic rhinitis due to animal (cat) (dog) hair and dander: Secondary | ICD-10-CM | POA: Diagnosis not present

## 2019-03-08 DIAGNOSIS — J301 Allergic rhinitis due to pollen: Secondary | ICD-10-CM | POA: Diagnosis not present

## 2019-03-12 DIAGNOSIS — M9902 Segmental and somatic dysfunction of thoracic region: Secondary | ICD-10-CM | POA: Diagnosis not present

## 2019-03-12 DIAGNOSIS — M9901 Segmental and somatic dysfunction of cervical region: Secondary | ICD-10-CM | POA: Diagnosis not present

## 2019-03-12 DIAGNOSIS — M9903 Segmental and somatic dysfunction of lumbar region: Secondary | ICD-10-CM | POA: Diagnosis not present

## 2019-03-12 DIAGNOSIS — M791 Myalgia, unspecified site: Secondary | ICD-10-CM | POA: Diagnosis not present

## 2019-03-13 DIAGNOSIS — J3089 Other allergic rhinitis: Secondary | ICD-10-CM | POA: Diagnosis not present

## 2019-03-13 DIAGNOSIS — J301 Allergic rhinitis due to pollen: Secondary | ICD-10-CM | POA: Diagnosis not present

## 2019-03-13 DIAGNOSIS — J3081 Allergic rhinitis due to animal (cat) (dog) hair and dander: Secondary | ICD-10-CM | POA: Diagnosis not present

## 2019-03-15 DIAGNOSIS — J3089 Other allergic rhinitis: Secondary | ICD-10-CM | POA: Diagnosis not present

## 2019-03-15 DIAGNOSIS — J3081 Allergic rhinitis due to animal (cat) (dog) hair and dander: Secondary | ICD-10-CM | POA: Diagnosis not present

## 2019-03-15 DIAGNOSIS — J301 Allergic rhinitis due to pollen: Secondary | ICD-10-CM | POA: Diagnosis not present

## 2019-03-21 DIAGNOSIS — T781XXD Other adverse food reactions, not elsewhere classified, subsequent encounter: Secondary | ICD-10-CM | POA: Diagnosis not present

## 2019-03-21 DIAGNOSIS — J4520 Mild intermittent asthma, uncomplicated: Secondary | ICD-10-CM | POA: Diagnosis not present

## 2019-03-21 DIAGNOSIS — J301 Allergic rhinitis due to pollen: Secondary | ICD-10-CM | POA: Diagnosis not present

## 2019-03-21 DIAGNOSIS — J3081 Allergic rhinitis due to animal (cat) (dog) hair and dander: Secondary | ICD-10-CM | POA: Diagnosis not present

## 2019-03-21 DIAGNOSIS — J3089 Other allergic rhinitis: Secondary | ICD-10-CM | POA: Diagnosis not present

## 2019-03-28 DIAGNOSIS — J301 Allergic rhinitis due to pollen: Secondary | ICD-10-CM | POA: Diagnosis not present

## 2019-03-28 DIAGNOSIS — J3081 Allergic rhinitis due to animal (cat) (dog) hair and dander: Secondary | ICD-10-CM | POA: Diagnosis not present

## 2019-03-28 DIAGNOSIS — J3089 Other allergic rhinitis: Secondary | ICD-10-CM | POA: Diagnosis not present

## 2019-03-29 DIAGNOSIS — Z01419 Encounter for gynecological examination (general) (routine) without abnormal findings: Secondary | ICD-10-CM | POA: Diagnosis not present

## 2019-03-29 DIAGNOSIS — Z124 Encounter for screening for malignant neoplasm of cervix: Secondary | ICD-10-CM | POA: Diagnosis not present

## 2019-04-04 DIAGNOSIS — J301 Allergic rhinitis due to pollen: Secondary | ICD-10-CM | POA: Diagnosis not present

## 2019-04-04 DIAGNOSIS — J3081 Allergic rhinitis due to animal (cat) (dog) hair and dander: Secondary | ICD-10-CM | POA: Diagnosis not present

## 2019-04-04 DIAGNOSIS — J3089 Other allergic rhinitis: Secondary | ICD-10-CM | POA: Diagnosis not present

## 2019-04-11 DIAGNOSIS — J3089 Other allergic rhinitis: Secondary | ICD-10-CM | POA: Diagnosis not present

## 2019-04-11 DIAGNOSIS — J301 Allergic rhinitis due to pollen: Secondary | ICD-10-CM | POA: Diagnosis not present

## 2019-04-11 DIAGNOSIS — J3081 Allergic rhinitis due to animal (cat) (dog) hair and dander: Secondary | ICD-10-CM | POA: Diagnosis not present

## 2019-04-18 DIAGNOSIS — J3081 Allergic rhinitis due to animal (cat) (dog) hair and dander: Secondary | ICD-10-CM | POA: Diagnosis not present

## 2019-04-18 DIAGNOSIS — J301 Allergic rhinitis due to pollen: Secondary | ICD-10-CM | POA: Diagnosis not present

## 2019-04-18 DIAGNOSIS — J3089 Other allergic rhinitis: Secondary | ICD-10-CM | POA: Diagnosis not present

## 2019-04-19 DIAGNOSIS — M546 Pain in thoracic spine: Secondary | ICD-10-CM | POA: Diagnosis not present

## 2019-04-19 DIAGNOSIS — M542 Cervicalgia: Secondary | ICD-10-CM | POA: Diagnosis not present

## 2019-04-19 DIAGNOSIS — M9901 Segmental and somatic dysfunction of cervical region: Secondary | ICD-10-CM | POA: Diagnosis not present

## 2019-04-19 DIAGNOSIS — M9902 Segmental and somatic dysfunction of thoracic region: Secondary | ICD-10-CM | POA: Diagnosis not present

## 2019-04-20 DIAGNOSIS — T781XXD Other adverse food reactions, not elsewhere classified, subsequent encounter: Secondary | ICD-10-CM | POA: Diagnosis not present

## 2019-04-25 DIAGNOSIS — J3089 Other allergic rhinitis: Secondary | ICD-10-CM | POA: Diagnosis not present

## 2019-04-25 DIAGNOSIS — J301 Allergic rhinitis due to pollen: Secondary | ICD-10-CM | POA: Diagnosis not present

## 2019-04-25 DIAGNOSIS — J3081 Allergic rhinitis due to animal (cat) (dog) hair and dander: Secondary | ICD-10-CM | POA: Diagnosis not present

## 2019-04-26 DIAGNOSIS — M9902 Segmental and somatic dysfunction of thoracic region: Secondary | ICD-10-CM | POA: Diagnosis not present

## 2019-04-26 DIAGNOSIS — M9901 Segmental and somatic dysfunction of cervical region: Secondary | ICD-10-CM | POA: Diagnosis not present

## 2019-04-26 DIAGNOSIS — M545 Low back pain: Secondary | ICD-10-CM | POA: Diagnosis not present

## 2019-04-26 DIAGNOSIS — M542 Cervicalgia: Secondary | ICD-10-CM | POA: Diagnosis not present

## 2019-04-26 DIAGNOSIS — M546 Pain in thoracic spine: Secondary | ICD-10-CM | POA: Diagnosis not present

## 2019-05-02 DIAGNOSIS — M545 Low back pain: Secondary | ICD-10-CM | POA: Diagnosis not present

## 2019-05-02 DIAGNOSIS — J3081 Allergic rhinitis due to animal (cat) (dog) hair and dander: Secondary | ICD-10-CM | POA: Diagnosis not present

## 2019-05-02 DIAGNOSIS — M9901 Segmental and somatic dysfunction of cervical region: Secondary | ICD-10-CM | POA: Diagnosis not present

## 2019-05-02 DIAGNOSIS — J301 Allergic rhinitis due to pollen: Secondary | ICD-10-CM | POA: Diagnosis not present

## 2019-05-02 DIAGNOSIS — J3089 Other allergic rhinitis: Secondary | ICD-10-CM | POA: Diagnosis not present

## 2019-05-02 DIAGNOSIS — M9902 Segmental and somatic dysfunction of thoracic region: Secondary | ICD-10-CM | POA: Diagnosis not present

## 2019-05-02 DIAGNOSIS — M542 Cervicalgia: Secondary | ICD-10-CM | POA: Diagnosis not present

## 2019-05-02 DIAGNOSIS — M546 Pain in thoracic spine: Secondary | ICD-10-CM | POA: Diagnosis not present

## 2019-05-10 DIAGNOSIS — M542 Cervicalgia: Secondary | ICD-10-CM | POA: Diagnosis not present

## 2019-05-10 DIAGNOSIS — M9901 Segmental and somatic dysfunction of cervical region: Secondary | ICD-10-CM | POA: Diagnosis not present

## 2019-05-10 DIAGNOSIS — M9902 Segmental and somatic dysfunction of thoracic region: Secondary | ICD-10-CM | POA: Diagnosis not present

## 2019-05-10 DIAGNOSIS — M545 Low back pain: Secondary | ICD-10-CM | POA: Diagnosis not present

## 2019-05-10 DIAGNOSIS — M546 Pain in thoracic spine: Secondary | ICD-10-CM | POA: Diagnosis not present

## 2019-05-16 DIAGNOSIS — J3081 Allergic rhinitis due to animal (cat) (dog) hair and dander: Secondary | ICD-10-CM | POA: Diagnosis not present

## 2019-05-16 DIAGNOSIS — J3089 Other allergic rhinitis: Secondary | ICD-10-CM | POA: Diagnosis not present

## 2019-05-16 DIAGNOSIS — J301 Allergic rhinitis due to pollen: Secondary | ICD-10-CM | POA: Diagnosis not present

## 2019-05-23 DIAGNOSIS — J3089 Other allergic rhinitis: Secondary | ICD-10-CM | POA: Diagnosis not present

## 2019-05-23 DIAGNOSIS — J3081 Allergic rhinitis due to animal (cat) (dog) hair and dander: Secondary | ICD-10-CM | POA: Diagnosis not present

## 2019-05-23 DIAGNOSIS — J301 Allergic rhinitis due to pollen: Secondary | ICD-10-CM | POA: Diagnosis not present

## 2019-05-30 DIAGNOSIS — J3081 Allergic rhinitis due to animal (cat) (dog) hair and dander: Secondary | ICD-10-CM | POA: Diagnosis not present

## 2019-05-30 DIAGNOSIS — J3089 Other allergic rhinitis: Secondary | ICD-10-CM | POA: Diagnosis not present

## 2019-05-30 DIAGNOSIS — J301 Allergic rhinitis due to pollen: Secondary | ICD-10-CM | POA: Diagnosis not present

## 2019-06-06 DIAGNOSIS — J301 Allergic rhinitis due to pollen: Secondary | ICD-10-CM | POA: Diagnosis not present

## 2019-06-06 DIAGNOSIS — J3081 Allergic rhinitis due to animal (cat) (dog) hair and dander: Secondary | ICD-10-CM | POA: Diagnosis not present

## 2019-06-06 DIAGNOSIS — J3089 Other allergic rhinitis: Secondary | ICD-10-CM | POA: Diagnosis not present

## 2019-06-13 DIAGNOSIS — J3081 Allergic rhinitis due to animal (cat) (dog) hair and dander: Secondary | ICD-10-CM | POA: Diagnosis not present

## 2019-06-13 DIAGNOSIS — J301 Allergic rhinitis due to pollen: Secondary | ICD-10-CM | POA: Diagnosis not present

## 2019-06-13 DIAGNOSIS — J3089 Other allergic rhinitis: Secondary | ICD-10-CM | POA: Diagnosis not present

## 2019-06-20 DIAGNOSIS — J3081 Allergic rhinitis due to animal (cat) (dog) hair and dander: Secondary | ICD-10-CM | POA: Diagnosis not present

## 2019-06-20 DIAGNOSIS — J301 Allergic rhinitis due to pollen: Secondary | ICD-10-CM | POA: Diagnosis not present

## 2019-06-20 DIAGNOSIS — J3089 Other allergic rhinitis: Secondary | ICD-10-CM | POA: Diagnosis not present

## 2019-06-25 DIAGNOSIS — M542 Cervicalgia: Secondary | ICD-10-CM | POA: Diagnosis not present

## 2019-06-25 DIAGNOSIS — M545 Low back pain: Secondary | ICD-10-CM | POA: Diagnosis not present

## 2019-06-25 DIAGNOSIS — M9901 Segmental and somatic dysfunction of cervical region: Secondary | ICD-10-CM | POA: Diagnosis not present

## 2019-06-25 DIAGNOSIS — M546 Pain in thoracic spine: Secondary | ICD-10-CM | POA: Diagnosis not present

## 2019-06-25 DIAGNOSIS — M9902 Segmental and somatic dysfunction of thoracic region: Secondary | ICD-10-CM | POA: Diagnosis not present

## 2019-06-27 DIAGNOSIS — J3081 Allergic rhinitis due to animal (cat) (dog) hair and dander: Secondary | ICD-10-CM | POA: Diagnosis not present

## 2019-06-27 DIAGNOSIS — J301 Allergic rhinitis due to pollen: Secondary | ICD-10-CM | POA: Diagnosis not present

## 2019-06-27 DIAGNOSIS — J3089 Other allergic rhinitis: Secondary | ICD-10-CM | POA: Diagnosis not present

## 2019-07-03 DIAGNOSIS — J3089 Other allergic rhinitis: Secondary | ICD-10-CM | POA: Diagnosis not present

## 2019-07-03 DIAGNOSIS — J301 Allergic rhinitis due to pollen: Secondary | ICD-10-CM | POA: Diagnosis not present

## 2019-07-03 DIAGNOSIS — J3081 Allergic rhinitis due to animal (cat) (dog) hair and dander: Secondary | ICD-10-CM | POA: Diagnosis not present

## 2019-07-11 DIAGNOSIS — J301 Allergic rhinitis due to pollen: Secondary | ICD-10-CM | POA: Diagnosis not present

## 2019-07-11 DIAGNOSIS — J3089 Other allergic rhinitis: Secondary | ICD-10-CM | POA: Diagnosis not present

## 2019-07-11 DIAGNOSIS — J3081 Allergic rhinitis due to animal (cat) (dog) hair and dander: Secondary | ICD-10-CM | POA: Diagnosis not present

## 2019-07-18 DIAGNOSIS — J301 Allergic rhinitis due to pollen: Secondary | ICD-10-CM | POA: Diagnosis not present

## 2019-07-18 DIAGNOSIS — J3089 Other allergic rhinitis: Secondary | ICD-10-CM | POA: Diagnosis not present

## 2019-07-18 DIAGNOSIS — J3081 Allergic rhinitis due to animal (cat) (dog) hair and dander: Secondary | ICD-10-CM | POA: Diagnosis not present

## 2019-07-25 DIAGNOSIS — J301 Allergic rhinitis due to pollen: Secondary | ICD-10-CM | POA: Diagnosis not present

## 2019-07-25 DIAGNOSIS — J3081 Allergic rhinitis due to animal (cat) (dog) hair and dander: Secondary | ICD-10-CM | POA: Diagnosis not present

## 2019-07-25 DIAGNOSIS — J3089 Other allergic rhinitis: Secondary | ICD-10-CM | POA: Diagnosis not present

## 2019-07-28 DIAGNOSIS — J3089 Other allergic rhinitis: Secondary | ICD-10-CM | POA: Diagnosis not present

## 2019-07-28 DIAGNOSIS — J3081 Allergic rhinitis due to animal (cat) (dog) hair and dander: Secondary | ICD-10-CM | POA: Diagnosis not present

## 2019-07-28 DIAGNOSIS — J301 Allergic rhinitis due to pollen: Secondary | ICD-10-CM | POA: Diagnosis not present

## 2019-08-01 DIAGNOSIS — J3089 Other allergic rhinitis: Secondary | ICD-10-CM | POA: Diagnosis not present

## 2019-08-01 DIAGNOSIS — J301 Allergic rhinitis due to pollen: Secondary | ICD-10-CM | POA: Diagnosis not present

## 2019-08-01 DIAGNOSIS — J3081 Allergic rhinitis due to animal (cat) (dog) hair and dander: Secondary | ICD-10-CM | POA: Diagnosis not present

## 2019-08-06 DIAGNOSIS — J301 Allergic rhinitis due to pollen: Secondary | ICD-10-CM | POA: Diagnosis not present

## 2019-08-06 DIAGNOSIS — M9902 Segmental and somatic dysfunction of thoracic region: Secondary | ICD-10-CM | POA: Diagnosis not present

## 2019-08-06 DIAGNOSIS — J3081 Allergic rhinitis due to animal (cat) (dog) hair and dander: Secondary | ICD-10-CM | POA: Diagnosis not present

## 2019-08-06 DIAGNOSIS — M546 Pain in thoracic spine: Secondary | ICD-10-CM | POA: Diagnosis not present

## 2019-08-06 DIAGNOSIS — M542 Cervicalgia: Secondary | ICD-10-CM | POA: Diagnosis not present

## 2019-08-06 DIAGNOSIS — M9901 Segmental and somatic dysfunction of cervical region: Secondary | ICD-10-CM | POA: Diagnosis not present

## 2019-08-06 DIAGNOSIS — M545 Low back pain: Secondary | ICD-10-CM | POA: Diagnosis not present

## 2019-08-06 DIAGNOSIS — J3089 Other allergic rhinitis: Secondary | ICD-10-CM | POA: Diagnosis not present

## 2019-08-08 DIAGNOSIS — J301 Allergic rhinitis due to pollen: Secondary | ICD-10-CM | POA: Diagnosis not present

## 2019-08-08 DIAGNOSIS — J3089 Other allergic rhinitis: Secondary | ICD-10-CM | POA: Diagnosis not present

## 2019-08-08 DIAGNOSIS — J3081 Allergic rhinitis due to animal (cat) (dog) hair and dander: Secondary | ICD-10-CM | POA: Diagnosis not present

## 2019-08-15 DIAGNOSIS — J3089 Other allergic rhinitis: Secondary | ICD-10-CM | POA: Diagnosis not present

## 2019-08-15 DIAGNOSIS — J301 Allergic rhinitis due to pollen: Secondary | ICD-10-CM | POA: Diagnosis not present

## 2019-08-15 DIAGNOSIS — J3081 Allergic rhinitis due to animal (cat) (dog) hair and dander: Secondary | ICD-10-CM | POA: Diagnosis not present

## 2019-08-22 DIAGNOSIS — J3089 Other allergic rhinitis: Secondary | ICD-10-CM | POA: Diagnosis not present

## 2019-08-22 DIAGNOSIS — J3081 Allergic rhinitis due to animal (cat) (dog) hair and dander: Secondary | ICD-10-CM | POA: Diagnosis not present

## 2019-08-22 DIAGNOSIS — J301 Allergic rhinitis due to pollen: Secondary | ICD-10-CM | POA: Diagnosis not present

## 2019-08-29 DIAGNOSIS — J3081 Allergic rhinitis due to animal (cat) (dog) hair and dander: Secondary | ICD-10-CM | POA: Diagnosis not present

## 2019-08-29 DIAGNOSIS — J301 Allergic rhinitis due to pollen: Secondary | ICD-10-CM | POA: Diagnosis not present

## 2019-08-29 DIAGNOSIS — J3089 Other allergic rhinitis: Secondary | ICD-10-CM | POA: Diagnosis not present

## 2019-09-05 DIAGNOSIS — J3081 Allergic rhinitis due to animal (cat) (dog) hair and dander: Secondary | ICD-10-CM | POA: Diagnosis not present

## 2019-09-05 DIAGNOSIS — J3089 Other allergic rhinitis: Secondary | ICD-10-CM | POA: Diagnosis not present

## 2019-09-05 DIAGNOSIS — J301 Allergic rhinitis due to pollen: Secondary | ICD-10-CM | POA: Diagnosis not present

## 2019-09-10 DIAGNOSIS — M9902 Segmental and somatic dysfunction of thoracic region: Secondary | ICD-10-CM | POA: Diagnosis not present

## 2019-09-10 DIAGNOSIS — M9901 Segmental and somatic dysfunction of cervical region: Secondary | ICD-10-CM | POA: Diagnosis not present

## 2019-09-10 DIAGNOSIS — M542 Cervicalgia: Secondary | ICD-10-CM | POA: Diagnosis not present

## 2019-09-10 DIAGNOSIS — M546 Pain in thoracic spine: Secondary | ICD-10-CM | POA: Diagnosis not present

## 2019-09-10 DIAGNOSIS — M25562 Pain in left knee: Secondary | ICD-10-CM | POA: Diagnosis not present

## 2019-09-12 DIAGNOSIS — J301 Allergic rhinitis due to pollen: Secondary | ICD-10-CM | POA: Diagnosis not present

## 2019-09-12 DIAGNOSIS — J3081 Allergic rhinitis due to animal (cat) (dog) hair and dander: Secondary | ICD-10-CM | POA: Diagnosis not present

## 2019-09-12 DIAGNOSIS — J3089 Other allergic rhinitis: Secondary | ICD-10-CM | POA: Diagnosis not present

## 2019-09-19 DIAGNOSIS — J3089 Other allergic rhinitis: Secondary | ICD-10-CM | POA: Diagnosis not present

## 2019-09-19 DIAGNOSIS — J301 Allergic rhinitis due to pollen: Secondary | ICD-10-CM | POA: Diagnosis not present

## 2019-09-19 DIAGNOSIS — J3081 Allergic rhinitis due to animal (cat) (dog) hair and dander: Secondary | ICD-10-CM | POA: Diagnosis not present

## 2019-09-26 DIAGNOSIS — J301 Allergic rhinitis due to pollen: Secondary | ICD-10-CM | POA: Diagnosis not present

## 2019-09-26 DIAGNOSIS — J3081 Allergic rhinitis due to animal (cat) (dog) hair and dander: Secondary | ICD-10-CM | POA: Diagnosis not present

## 2019-09-26 DIAGNOSIS — J3089 Other allergic rhinitis: Secondary | ICD-10-CM | POA: Diagnosis not present

## 2019-09-29 DIAGNOSIS — J301 Allergic rhinitis due to pollen: Secondary | ICD-10-CM | POA: Diagnosis not present

## 2019-09-29 DIAGNOSIS — J3081 Allergic rhinitis due to animal (cat) (dog) hair and dander: Secondary | ICD-10-CM | POA: Diagnosis not present

## 2019-09-29 DIAGNOSIS — J3089 Other allergic rhinitis: Secondary | ICD-10-CM | POA: Diagnosis not present

## 2019-10-03 DIAGNOSIS — J3089 Other allergic rhinitis: Secondary | ICD-10-CM | POA: Diagnosis not present

## 2019-10-03 DIAGNOSIS — J3081 Allergic rhinitis due to animal (cat) (dog) hair and dander: Secondary | ICD-10-CM | POA: Diagnosis not present

## 2019-10-03 DIAGNOSIS — J301 Allergic rhinitis due to pollen: Secondary | ICD-10-CM | POA: Diagnosis not present

## 2019-10-10 DIAGNOSIS — J3089 Other allergic rhinitis: Secondary | ICD-10-CM | POA: Diagnosis not present

## 2019-10-10 DIAGNOSIS — J3081 Allergic rhinitis due to animal (cat) (dog) hair and dander: Secondary | ICD-10-CM | POA: Diagnosis not present

## 2019-10-10 DIAGNOSIS — J301 Allergic rhinitis due to pollen: Secondary | ICD-10-CM | POA: Diagnosis not present

## 2019-10-15 DIAGNOSIS — M542 Cervicalgia: Secondary | ICD-10-CM | POA: Diagnosis not present

## 2019-10-15 DIAGNOSIS — M9901 Segmental and somatic dysfunction of cervical region: Secondary | ICD-10-CM | POA: Diagnosis not present

## 2019-10-15 DIAGNOSIS — M545 Low back pain: Secondary | ICD-10-CM | POA: Diagnosis not present

## 2019-10-15 DIAGNOSIS — M546 Pain in thoracic spine: Secondary | ICD-10-CM | POA: Diagnosis not present

## 2019-10-15 DIAGNOSIS — M9903 Segmental and somatic dysfunction of lumbar region: Secondary | ICD-10-CM | POA: Diagnosis not present

## 2019-10-15 DIAGNOSIS — M9902 Segmental and somatic dysfunction of thoracic region: Secondary | ICD-10-CM | POA: Diagnosis not present

## 2019-10-17 DIAGNOSIS — J301 Allergic rhinitis due to pollen: Secondary | ICD-10-CM | POA: Diagnosis not present

## 2019-10-17 DIAGNOSIS — J3089 Other allergic rhinitis: Secondary | ICD-10-CM | POA: Diagnosis not present

## 2019-10-17 DIAGNOSIS — J3081 Allergic rhinitis due to animal (cat) (dog) hair and dander: Secondary | ICD-10-CM | POA: Diagnosis not present

## 2019-10-24 DIAGNOSIS — J301 Allergic rhinitis due to pollen: Secondary | ICD-10-CM | POA: Diagnosis not present

## 2019-10-24 DIAGNOSIS — J3081 Allergic rhinitis due to animal (cat) (dog) hair and dander: Secondary | ICD-10-CM | POA: Diagnosis not present

## 2019-10-24 DIAGNOSIS — J3089 Other allergic rhinitis: Secondary | ICD-10-CM | POA: Diagnosis not present

## 2019-11-07 DIAGNOSIS — J3089 Other allergic rhinitis: Secondary | ICD-10-CM | POA: Diagnosis not present

## 2019-11-07 DIAGNOSIS — J301 Allergic rhinitis due to pollen: Secondary | ICD-10-CM | POA: Diagnosis not present

## 2019-11-07 DIAGNOSIS — J3081 Allergic rhinitis due to animal (cat) (dog) hair and dander: Secondary | ICD-10-CM | POA: Diagnosis not present

## 2019-11-14 DIAGNOSIS — J3089 Other allergic rhinitis: Secondary | ICD-10-CM | POA: Diagnosis not present

## 2019-11-14 DIAGNOSIS — J301 Allergic rhinitis due to pollen: Secondary | ICD-10-CM | POA: Diagnosis not present

## 2019-11-14 DIAGNOSIS — J3081 Allergic rhinitis due to animal (cat) (dog) hair and dander: Secondary | ICD-10-CM | POA: Diagnosis not present

## 2019-11-19 DIAGNOSIS — M9902 Segmental and somatic dysfunction of thoracic region: Secondary | ICD-10-CM | POA: Diagnosis not present

## 2019-11-19 DIAGNOSIS — M546 Pain in thoracic spine: Secondary | ICD-10-CM | POA: Diagnosis not present

## 2019-11-19 DIAGNOSIS — M542 Cervicalgia: Secondary | ICD-10-CM | POA: Diagnosis not present

## 2019-11-19 DIAGNOSIS — M545 Low back pain: Secondary | ICD-10-CM | POA: Diagnosis not present

## 2019-11-19 DIAGNOSIS — M9901 Segmental and somatic dysfunction of cervical region: Secondary | ICD-10-CM | POA: Diagnosis not present

## 2019-11-19 DIAGNOSIS — M9903 Segmental and somatic dysfunction of lumbar region: Secondary | ICD-10-CM | POA: Diagnosis not present

## 2019-11-21 DIAGNOSIS — J3089 Other allergic rhinitis: Secondary | ICD-10-CM | POA: Diagnosis not present

## 2019-11-21 DIAGNOSIS — J3081 Allergic rhinitis due to animal (cat) (dog) hair and dander: Secondary | ICD-10-CM | POA: Diagnosis not present

## 2019-11-21 DIAGNOSIS — J301 Allergic rhinitis due to pollen: Secondary | ICD-10-CM | POA: Diagnosis not present

## 2019-11-28 DIAGNOSIS — J3081 Allergic rhinitis due to animal (cat) (dog) hair and dander: Secondary | ICD-10-CM | POA: Diagnosis not present

## 2019-11-28 DIAGNOSIS — J3089 Other allergic rhinitis: Secondary | ICD-10-CM | POA: Diagnosis not present

## 2019-11-28 DIAGNOSIS — J301 Allergic rhinitis due to pollen: Secondary | ICD-10-CM | POA: Diagnosis not present

## 2019-12-05 DIAGNOSIS — J3081 Allergic rhinitis due to animal (cat) (dog) hair and dander: Secondary | ICD-10-CM | POA: Diagnosis not present

## 2019-12-05 DIAGNOSIS — J3089 Other allergic rhinitis: Secondary | ICD-10-CM | POA: Diagnosis not present

## 2019-12-05 DIAGNOSIS — J301 Allergic rhinitis due to pollen: Secondary | ICD-10-CM | POA: Diagnosis not present

## 2019-12-29 IMAGING — CT CT ABDOMEN AND PELVIS WITH CONTRAST
2 of 4 series · 15 of 46 positions shown, 17 images · IV contrast (omnipaque)
Comparison: None.

CLINICAL DATA: "Sudden onset RUQ/LUQ abdominal pain with radiation
to right shoulder, nausea. +RUQ tenderness on exam, leukocytosis.
highest concern for gallbladder issues, but also periumbilical
tenderness.

EXAM:
CT ABDOMEN AND PELVIS WITH CONTRAST
TECHNIQUE: Multidetector CT imaging of the abdomen and pelvis was performed
using the standard protocol following bolus administration of
intravenous contrast.
CONTRAST:  100mL OMNIPAQUE IOHEXOL 300 MG/ML  SOLN

[Series 2: axial st · axial · 0.72mm/px · z∈[-385,+15]mm · 12 of 92 slices shown, 14 images]
[im 6/92  soft-tissue]
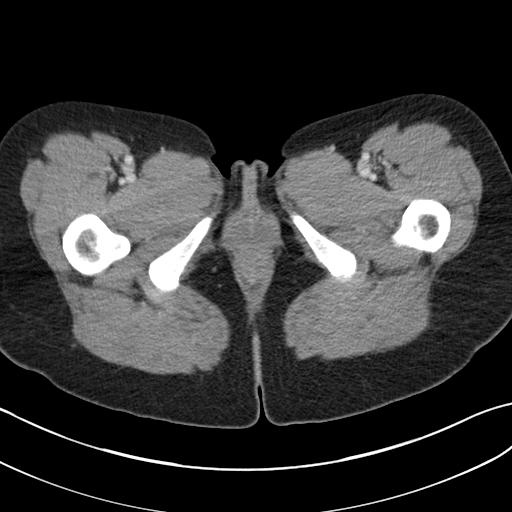
[im 6/92  bone]
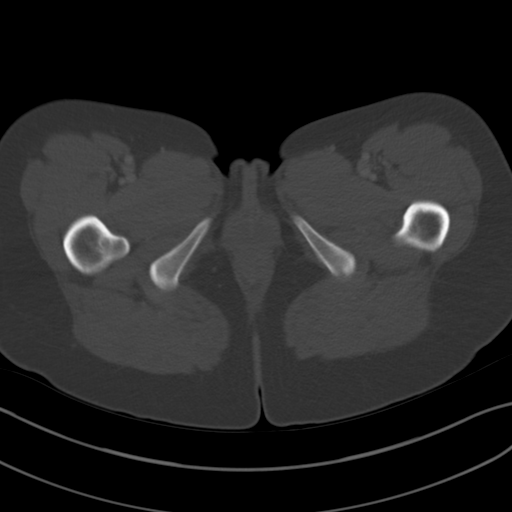
[im 16/92  soft-tissue]
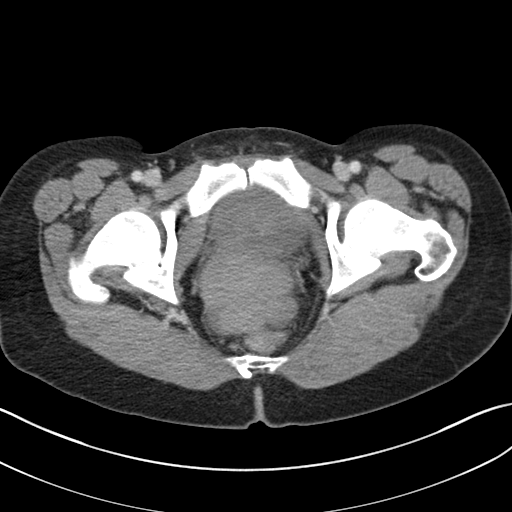
[im 21/92  soft-tissue]
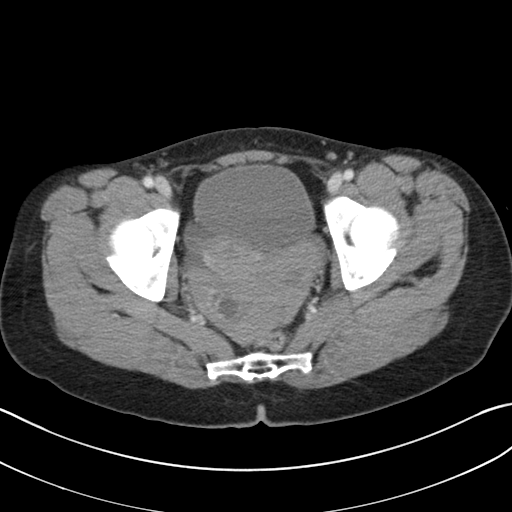
[im 26/92  soft-tissue]
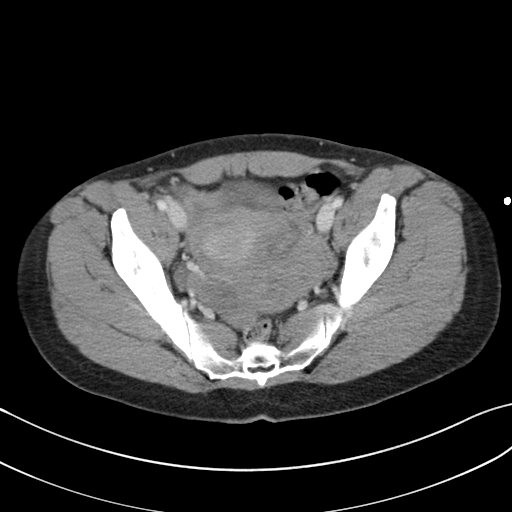
[im 36/92  soft-tissue]
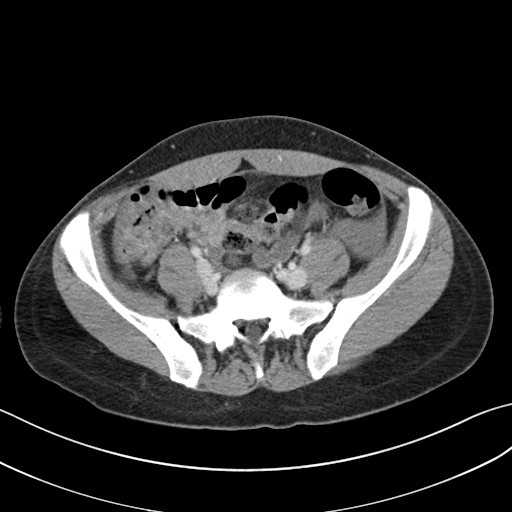
[im 41/92  soft-tissue]
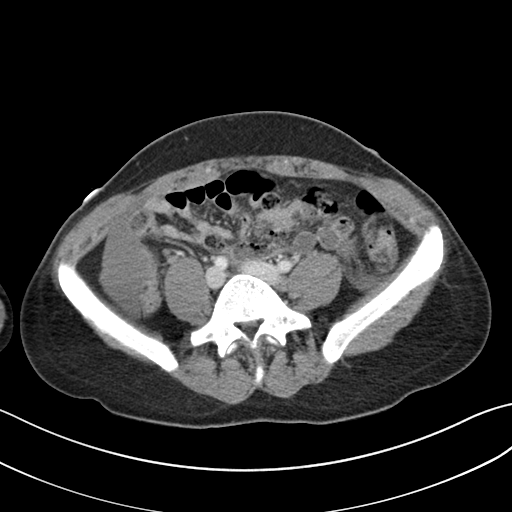
[im 51/92  soft-tissue]
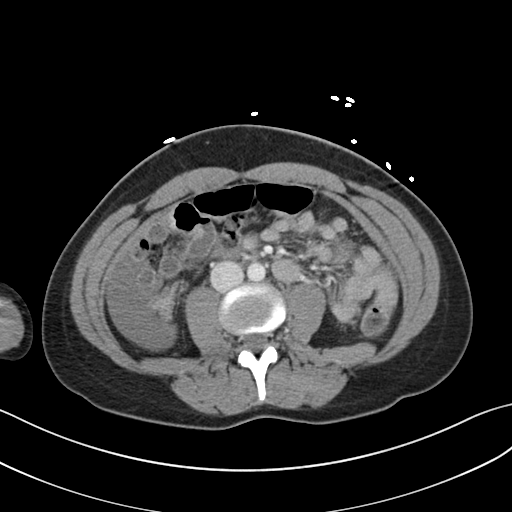
[im 56/92  soft-tissue]
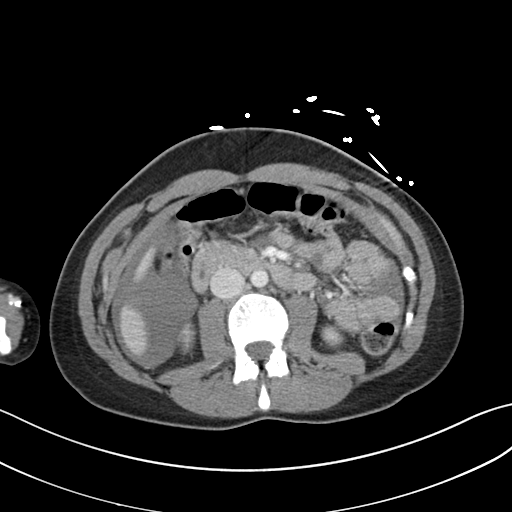
[im 66/92  soft-tissue]
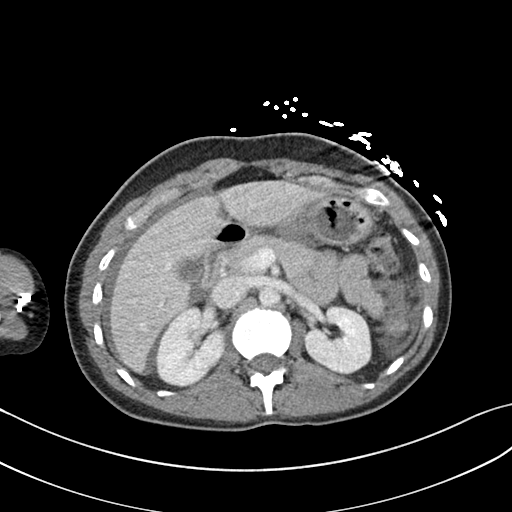
[im 66/92  bone]
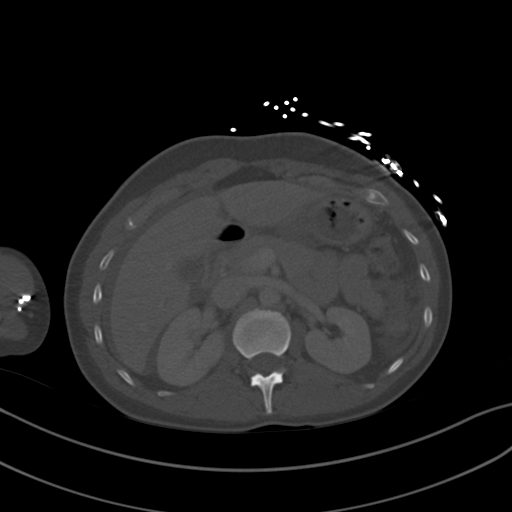
[im 71/92  soft-tissue]
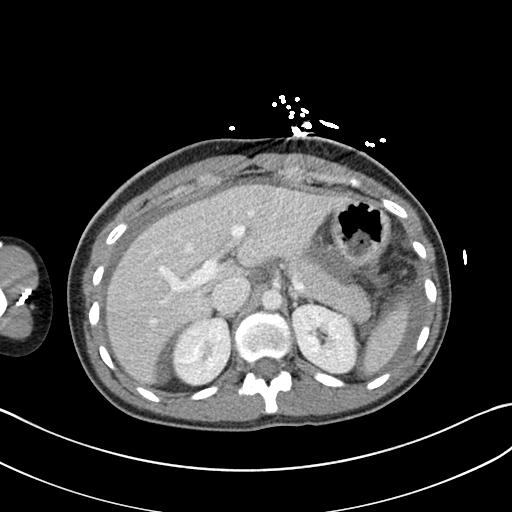
[im 76/92  soft-tissue]
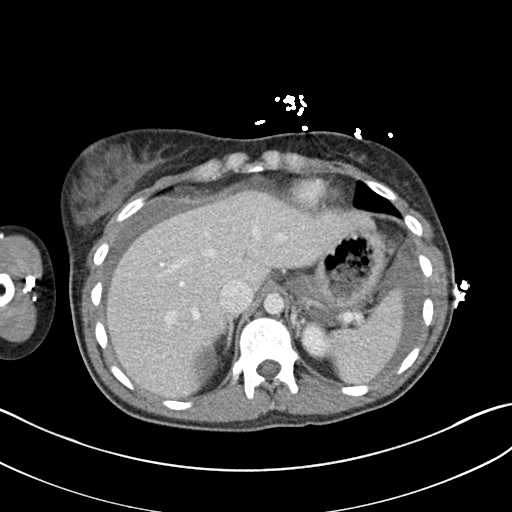
[im 86/92  soft-tissue]
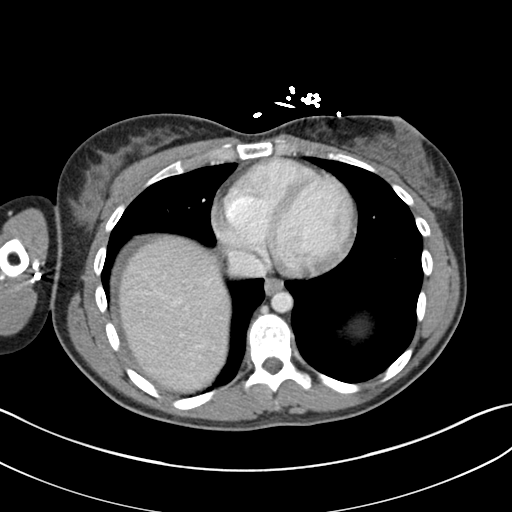

[Series 5: coronal st · coronal · 0.66mm/px · 3 of 119 slices shown]
[im 40/119  soft-tissue]
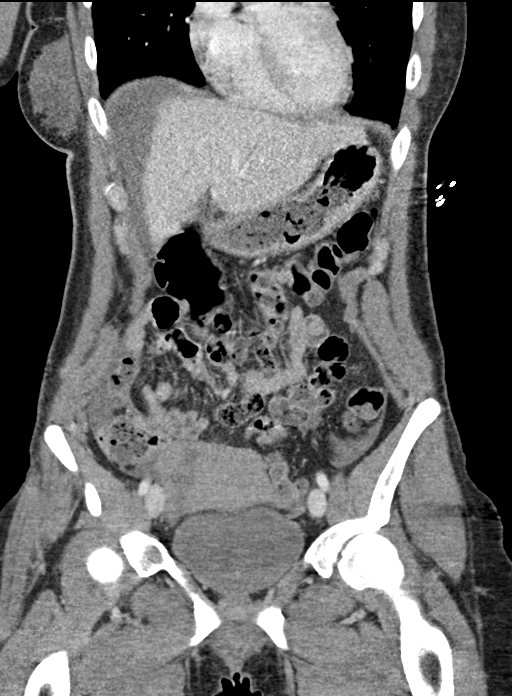
[im 53/119  soft-tissue]
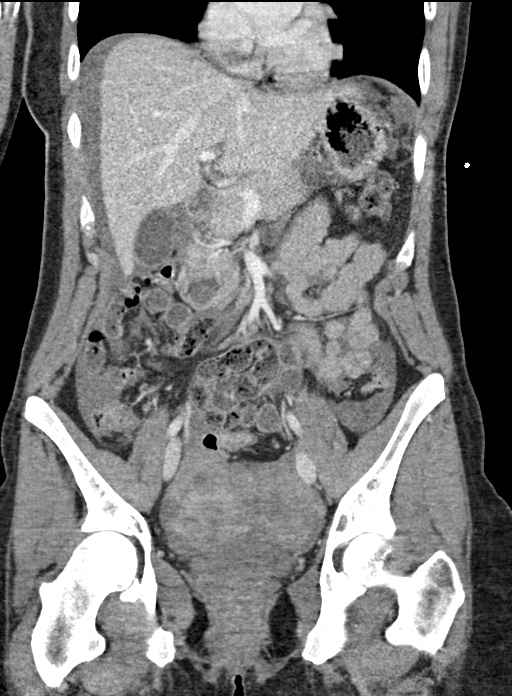
[im 66/119  soft-tissue]
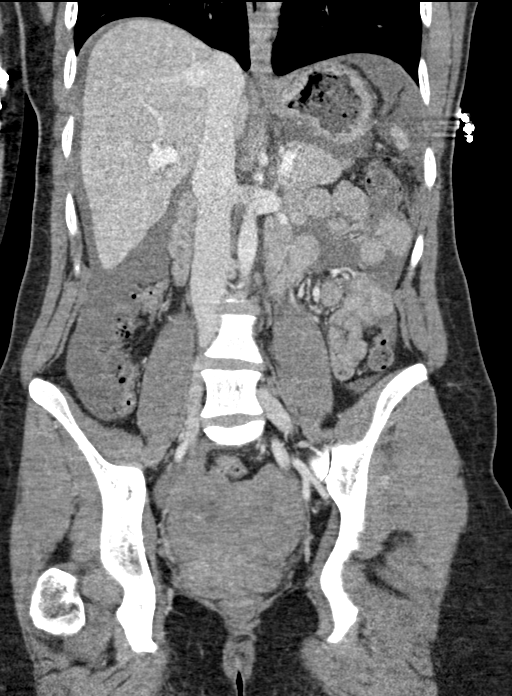

[15 of 46 positions shown; findings below may reference images not displayed]

FINDINGS: Lower chest: Mild hypoventilatory changes at the bases.

Hepatobiliary: No focal liver abnormality is seen. No gallstones,
gallbladder wall thickening, or biliary dilatation.

Pancreas: No ductal dilatation or inflammation.

Spleen: Normal in size without focal abnormality. Small cleft
posteriorly.

Adrenals/Urinary Tract: Adrenal glands are unremarkable. Kidneys are
normal, without renal calculi, focal lesion, or hydronephrosis.
Bladder is unremarkable.

Stomach/Bowel: Heterogeneous fluid in both paracolic gutters, as
well as in the pelvis, in conjunction with lack of enteric contrast
limits bowel assessment. Stomach is nondistended. No bowel
obstruction. The appendix is not confidently visualized. Small
volume of colonic stool without colonic inflammation.

Vascular/Lymphatic: Heterogeneous fluid throughout the abdomen and
pelvis suspicious for blood. No evidence of active extravasation.
Abdominal aorta is normal in caliber. Portal vein is patent.

Reproductive: 18 mm low-density structure in the right adnexa with
surrounding soft tissue density. More inferiorly is a probable 16 mm
peripherally enhancing corpus luteal cyst. Heterogeneous ill-defined
density in the left adnexa. Uterus is anteverted and not
well-defined.

Other: Large volume of heterogeneous fluid in the abdomen and pelvis
consistent with blood. Source felt to be the pelvis/adnexa, with
fluid tracking into the bilateral pericolic gutters, small bowel
mesentery and upper quadrants. No free intra-abdominal air. No
loculated abscess.

Musculoskeletal: There are no acute or suspicious osseous
abnormalities.
IMPRESSION: 1. Large volume of heterogeneous fluid in the abdomen and pelvis
consistent with blood. Source felt to be the pelvis/adnexa. There is
a peripherally enhancing cyst in the right adnexa, with CT
appearance suggestive of corpus luteum. Additional 18 mm cystic
density in the right adnexa. Differential considerations include
ruptured hemorrhagic ovarian cyst versus ectopic pregnancy, however
patient had a negative beta HCG today in the ER which would make
ectopic pregnancy unlikely. Recommend GYN consultation.
2. Normal CT appearance of gallbladder. Appendix not visualized,
however appearance is not suggestive of ruptured appendicitis.

These results were called by telephone at the time of interpretation
on 10/29/2018 at [DATE] to PA GERMELINO RINCK , who verbally
acknowledged these results.
